# Patient Record
Sex: Female | Born: 1960 | Race: Black or African American | Hispanic: No | Smoking: Never smoker
Health system: Southern US, Community
[De-identification: ages and names within clinical notes are randomized; demographics above are authoritative.]

## PROBLEM LIST (undated history)

## (undated) DIAGNOSIS — R112 Nausea with vomiting, unspecified: Secondary | ICD-10-CM

## (undated) DIAGNOSIS — Z992 Dependence on renal dialysis: Secondary | ICD-10-CM

## (undated) DIAGNOSIS — N186 End stage renal disease: Secondary | ICD-10-CM

## (undated) DIAGNOSIS — H5461 Unqualified visual loss, right eye, normal vision left eye: Secondary | ICD-10-CM

## (undated) DIAGNOSIS — Z973 Presence of spectacles and contact lenses: Secondary | ICD-10-CM

## (undated) DIAGNOSIS — M109 Gout, unspecified: Secondary | ICD-10-CM

## (undated) DIAGNOSIS — I10 Essential (primary) hypertension: Secondary | ICD-10-CM

## (undated) DIAGNOSIS — Z9889 Other specified postprocedural states: Secondary | ICD-10-CM

## (undated) HISTORY — PX: CHOLECYSTECTOMY: SHX55

## (undated) HISTORY — PX: BREAST SURGERY: SHX581

## (undated) HISTORY — PX: ABDOMINAL HYSTERECTOMY: SHX81

## (undated) HISTORY — PX: COLONOSCOPY: SHX174

---

## 2000-12-21 ENCOUNTER — Other Ambulatory Visit: Admission: RE | Admit: 2000-12-21 | Discharge: 2000-12-21 | Payer: Self-pay | Admitting: *Deleted

## 2002-04-03 ENCOUNTER — Other Ambulatory Visit: Admission: RE | Admit: 2002-04-03 | Discharge: 2002-04-03 | Payer: Self-pay | Admitting: Obstetrics and Gynecology

## 2003-04-19 ENCOUNTER — Other Ambulatory Visit: Admission: RE | Admit: 2003-04-19 | Discharge: 2003-04-19 | Payer: Self-pay | Admitting: Obstetrics and Gynecology

## 2003-12-13 ENCOUNTER — Ambulatory Visit (HOSPITAL_COMMUNITY): Admission: RE | Admit: 2003-12-13 | Discharge: 2003-12-13 | Payer: Self-pay | Admitting: Orthopedic Surgery

## 2004-04-29 ENCOUNTER — Other Ambulatory Visit: Admission: RE | Admit: 2004-04-29 | Discharge: 2004-04-29 | Payer: Self-pay | Admitting: Obstetrics and Gynecology

## 2004-07-05 ENCOUNTER — Emergency Department (HOSPITAL_COMMUNITY): Admission: EM | Admit: 2004-07-05 | Discharge: 2004-07-06 | Payer: Self-pay | Admitting: Emergency Medicine

## 2004-11-16 ENCOUNTER — Emergency Department (HOSPITAL_COMMUNITY): Admission: EM | Admit: 2004-11-16 | Discharge: 2004-11-16 | Payer: Self-pay | Admitting: Emergency Medicine

## 2007-01-05 ENCOUNTER — Ambulatory Visit (HOSPITAL_COMMUNITY): Admission: RE | Admit: 2007-01-05 | Discharge: 2007-01-05 | Payer: Self-pay | Admitting: Family Medicine

## 2009-08-27 ENCOUNTER — Encounter: Admission: RE | Admit: 2009-08-27 | Discharge: 2009-08-27 | Payer: Self-pay | Admitting: Otolaryngology

## 2010-08-10 ENCOUNTER — Encounter: Payer: Self-pay | Admitting: Orthopedic Surgery

## 2011-07-28 ENCOUNTER — Emergency Department (HOSPITAL_COMMUNITY)
Admission: EM | Admit: 2011-07-28 | Discharge: 2011-07-28 | Disposition: A | Discharge status: Home / Self Care | Payer: Managed Care, Other (non HMO) | Source: Home / Self Care

## 2011-07-28 DIAGNOSIS — J019 Acute sinusitis, unspecified: Secondary | ICD-10-CM

## 2011-07-28 HISTORY — DX: Essential (primary) hypertension: I10

## 2011-07-28 MED ORDER — BENZONATATE 100 MG PO CAPS: ORAL_CAPSULE | ORAL | Status: AC

## 2011-07-28 MED ORDER — AMOXICILLIN-POT CLAVULANATE 875-125 MG PO TABS: 1.0000 | ORAL_TABLET | Freq: Two times a day (BID) | ORAL | Status: AC

## 2011-07-28 NOTE — ED Provider Notes (Signed)
Medical screening examination/treatment/procedure(s) were performed by non-physician practitioner and as supervising physician I was immediately available for consultation/collaboration.  LANEY,RONNIE   Ronnie Laney, MD 07/28/11 2212 

## 2011-07-28 NOTE — ED Provider Notes (Signed)
History     CSN: [id number]  Arrival date & time 07/28/11  1356   None     Chief Complaint  Patient presents with  . URI    (Consider location/radiation/quality/duration/timing/severity/associated sxs/prior treatment) HPI Comments: Pt c/o nasal congestion, HA and cough x 7 days. She states her nasal congestion is "green and lumpy." Her cough worsens when she lies down. She has been taking otc cold medications such as Dayquil without relief. She has a hx of HTN and is not currently on BP medication.   Past Medical History  Diagnosis Date  . Hypertension     Past Surgical History  Procedure Date  . Abdominal hysterectomy     History reviewed. No pertinent family history.  History  Substance Use Topics  . Smoking status: Never Smoker   . Smokeless tobacco: Not on file  . Alcohol Use: No    OB History    Grav Para Term Preterm Abortions TAB SAB Ect Mult Living                  Review of Systems  Constitutional: Negative for fever, chills and fatigue.  HENT: Positive for congestion, rhinorrhea, postnasal drip and sinus pressure. Negative for ear pain, sore throat and sneezing.   Respiratory: Positive for cough. Negative for shortness of breath and wheezing.   Cardiovascular: Negative for chest pain and palpitations.    Allergies  Review of patient's allergies indicates no known allergies.  Home Medications   Current Outpatient Rx  Name Route Sig Dispense Refill  . AMOXICILLIN-POT CLAVULANATE 875-125 MG PO TABS Oral Take 1 tablet by mouth 2 (two) times daily with a meal. 20 tablet 0  . BENZONATATE 100 MG PO CAPS  1-2 caps every 8 hrs prn cough 30 capsule 0    BP 166/93  Pulse 67  Temp(Src) 98.3 F (36.8 C) (Oral)  Resp 20  SpO2 100%  Physical Exam  Nursing note and vitals reviewed. Constitutional: She appears well-developed and well-nourished. No distress.  HENT:  Head: Normocephalic and atraumatic.  Right Ear: Tympanic membrane, external ear and ear  canal normal.  Left Ear: Tympanic membrane, external ear and ear canal normal.  Nose: Nose normal.  Mouth/Throat: Uvula is midline, oropharynx is clear and moist and mucous membranes are normal. No oropharyngeal exudate, posterior oropharyngeal edema or posterior oropharyngeal erythema.  Neck: Neck supple.  Cardiovascular: Normal rate, regular rhythm and normal heart sounds.   Pulmonary/Chest: Effort normal and breath sounds normal. No respiratory distress.  Lymphadenopathy:    She has no cervical adenopathy.  Neurological: She is alert.  Skin: Skin is warm and dry.  Psychiatric: She has a normal mood and affect.    ED Course  Procedures (including critical care time)  Labs Reviewed - No data to display No results found.   1. Acute sinusitis       MDM   Discussed with pt that otc decongestants can elevate BP. To have BP rechecked in 2 weeks when feeling better.       Carmel [place], [place] 07/28/11 1553

## 2011-07-28 NOTE — ED Notes (Signed)
C/o productive cough of green sputum, congestion, runny nose, chills for 7 days.

## 2011-10-12 ENCOUNTER — Telehealth: Payer: Self-pay

## 2011-10-20 ENCOUNTER — Other Ambulatory Visit: Payer: Self-pay

## 2011-10-20 ENCOUNTER — Telehealth: Payer: Self-pay

## 2011-10-20 DIAGNOSIS — Z139 Encounter for screening, unspecified: Secondary | ICD-10-CM

## 2011-10-20 NOTE — Telephone Encounter (Signed)
LMOM to call. ( pt was triaged on 10/10/2011 at colon cancer screening) need to schedule appt with Dr. Gala Romney.

## 2011-10-20 NOTE — Telephone Encounter (Signed)
Pt called to schedule her colonoscopy with Dr. Gala Romney. ( She saw him many years ago for her stomach) She needed a Fri. Next available with Dr. Gala Romney is on 12/11/2011. I will need to update triage prior to that0 She was traiged by Ginger on 10/10/2011 at the colon cancer screening.   Gastroenterology Pre-Procedure Form  Request Date: 10/10/2011     Requesting Physician: Nena Polio     PATIENT INFORMATION:  Angela Kerr is a 51 y.o., female (DOB=1960-10-03).  PROCEDURE: Procedure(s) requested: colonoscopy Procedure Reason: screening for colon cancer  PATIENT REVIEW QUESTIONS: The patient reports the following:   1. Diabetes Melitis: no 2. Joint replacements in the past 12 months: no 3. Major health problems in the past 3 months: no 4. Has an artificial valve or MVP:no 5. Has been advised in past to take antibiotics in advance of a procedure like teeth cleaning: no}    MEDICATIONS & ALLERGIES:    Patient reports the following regarding taking any blood thinners:   Plavix? no Aspirin?no Coumadin?  no  Patient confirms/reports the following medications:  Current Outpatient Prescriptions  Medication Sig Dispense Refill  . furosemide (LASIX) 20 MG tablet Take 20 mg by mouth daily.      Marland Kitchen lisinopril (PRINIVIL,ZESTRIL) 20 MG tablet Take 20 mg by mouth daily.        Patient confirms/reports the following allergies:  Allergies  Allergen Reactions  . Codeine Nausea And Vomiting    Patient is appropriate to schedule for requested procedure(s): yes  AUTHORIZATION INFORMATION Primary Insurance:   ID #:   Group #:  Pre-Cert / Auth required: Pre-Cert / Auth #:   Secondary Insurance:  ID #:   Group #:  Pre-Cert / Auth required: Pre-Cert / Auth #:   No orders of the defined types were placed in this encounter.    SCHEDULE INFORMATION: Procedure has been scheduled as follows:  Date: 12/11/2011    Time: 7:30 AM  Location: [facility] Short Stay  This  Gastroenterology Pre-Precedure Form is being routed to the following provider(s) for review: R. Garfield Cornea, MD

## 2011-10-20 NOTE — Telephone Encounter (Signed)
LMOM to call.

## 2011-10-21 NOTE — Telephone Encounter (Signed)
Per Ginger who triaged at the colon cancer screening, pt is AA.

## 2011-10-21 NOTE — Telephone Encounter (Signed)
Rx and instructions mailed.

## 2011-10-21 NOTE — Telephone Encounter (Signed)
Is she Caucasian or AA? If not AA, her insurance company may not pay for TCS until she turns 52. Please check on this.

## 2011-11-23 ENCOUNTER — Telehealth: Payer: Self-pay

## 2011-11-23 NOTE — Telephone Encounter (Signed)
Per Danny Lawless, email from Blair Hailey, stating that [employer] will not cover a screening colonoscopy. Michela Pitcher it would pay if she had a problem or a family hx of colon cancer. I tried to call pt and no answer. Mailed letter for her to call. Cancelled the appt for 12/11/2011 with Dr. Gala Romney. Informed Audelia Acton in Endo.

## 2011-12-11 ENCOUNTER — Encounter (HOSPITAL_COMMUNITY): Admission: RE | Payer: Self-pay | Source: Ambulatory Visit

## 2011-12-11 ENCOUNTER — Ambulatory Visit (HOSPITAL_COMMUNITY)
Admission: RE | Admit: 2011-12-11 | Payer: Managed Care, Other (non HMO) | Source: Ambulatory Visit | Admitting: Internal Medicine

## 2011-12-11 SURGERY — COLONOSCOPY
Anesthesia: Moderate Sedation

## 2012-03-01 ENCOUNTER — Emergency Department (HOSPITAL_COMMUNITY): Payer: Managed Care, Other (non HMO)

## 2012-03-01 ENCOUNTER — Emergency Department (HOSPITAL_COMMUNITY)
Admission: EM | Admit: 2012-03-01 | Discharge: 2012-03-01 | Disposition: A | Discharge status: Home / Self Care | Payer: Managed Care, Other (non HMO) | Attending: Emergency Medicine | Admitting: Emergency Medicine

## 2012-03-01 ENCOUNTER — Encounter (HOSPITAL_COMMUNITY): Payer: Self-pay | Admitting: *Deleted

## 2012-03-01 DIAGNOSIS — I10 Essential (primary) hypertension: Secondary | ICD-10-CM | POA: Insufficient documentation

## 2012-03-01 DIAGNOSIS — M659 Unspecified synovitis and tenosynovitis, unspecified site: Secondary | ICD-10-CM

## 2012-03-01 DIAGNOSIS — R229 Localized swelling, mass and lump, unspecified: Secondary | ICD-10-CM | POA: Insufficient documentation

## 2012-03-01 LAB — CBC WITH DIFFERENTIAL/PLATELET
Basophils Absolute: 0 10*3/uL (ref 0.0–0.1)
Basophils Relative: 0 % (ref 0–1)
Eosinophils Absolute: 0.1 10*3/uL (ref 0.0–0.7)
Eosinophils Relative: 2 % (ref 0–5)
HCT: 39.7 % (ref 36.0–46.0)
Hemoglobin: 13.1 g/dL (ref 12.0–15.0)
Lymphocytes Relative: 30 % (ref 12–46)
Lymphs Abs: 1.7 10*3/uL (ref 0.7–4.0)
MCH: 29.4 pg (ref 26.0–34.0)
MCHC: 33 g/dL (ref 30.0–36.0)
MCV: 89 fL (ref 78.0–100.0)
Monocytes Absolute: 0.3 10*3/uL (ref 0.1–1.0)
Monocytes Relative: 6 % (ref 3–12)
Neutro Abs: 3.5 10*3/uL (ref 1.7–7.7)
Neutrophils Relative %: 61 % (ref 43–77)
Platelets: 383 10*3/uL (ref 150–400)
RBC: 4.46 MIL/uL (ref 3.87–5.11)
RDW: 12.8 % (ref 11.5–15.5)
WBC: 5.8 10*3/uL (ref 4.0–10.5)

## 2012-03-01 MED ORDER — CLINDAMYCIN HCL 150 MG PO CAPS
300.0000 mg | ORAL_CAPSULE | Freq: Once | ORAL | Status: AC
  Administered 2012-03-01: 300 mg via ORAL
  Filled 2012-03-01: qty 2

## 2012-03-01 MED ORDER — HYDROCODONE-ACETAMINOPHEN 5-325 MG PO TABS: 1.0000 | ORAL_TABLET | Freq: Four times a day (QID) | ORAL | Status: AC | PRN

## 2012-03-01 MED ORDER — BUPIVACAINE HCL 0.5 % IJ SOLN
15.0000 mL | Freq: Once | INTRAMUSCULAR | Status: DC
  Filled 2012-03-01: qty 15

## 2012-03-01 MED ORDER — INDOMETHACIN ER 75 MG PO CPCR: 75.0000 mg | ORAL_CAPSULE | Freq: Two times a day (BID) | ORAL | Status: AC

## 2012-03-01 MED ORDER — CEPHALEXIN 500 MG PO CAPS: 500.0000 mg | ORAL_CAPSULE | Freq: Four times a day (QID) | ORAL | Status: AC

## 2012-03-01 MED ORDER — BUPIVACAINE HCL 0.25 % IJ SOLN
20.0000 mL | Freq: Once | INTRAMUSCULAR | Status: DC
  Filled 2012-03-01: qty 20

## 2012-03-01 MED ORDER — BUPIVACAINE-EPINEPHRINE (PF) 0.5% -1:200000 IJ SOLN
INTRAMUSCULAR | Status: AC
  Filled 2012-03-01: qty 1.8

## 2012-03-01 MED ORDER — CLINDAMYCIN PHOSPHATE 600 MG/50ML IV SOLN
600.0000 mg | Freq: Once | INTRAVENOUS | Status: AC
  Administered 2012-03-01: 600 mg via INTRAVENOUS
  Filled 2012-03-01: qty 50

## 2012-03-01 NOTE — Consult Note (Signed)
  See full consult note # [id number] Roseanne Kaufman MD

## 2012-03-01 NOTE — Discharge Summary (Signed)
  Dx synovitis left thumb See full consult note Roseanne Kaufman MD

## 2012-03-01 NOTE — ED Notes (Signed)
Pt is here with left thumb swelling

## 2012-03-01 NOTE — ED Notes (Signed)
Pt waiting for Dr.Gamig to see pt.  Pt updated on plan of care.

## 2012-03-01 NOTE — ED Provider Notes (Signed)
History    This chart was scribed for Angela Johns, MD, MD by Angela Kerr. The patient was seen in room [place] and the patient's care was started at 3:33PM.   CSN: [id number]  Arrival date & time 03/01/12  1328   First MD Initiated Contact with Patient 03/01/12 1532      Chief Complaint  Patient presents with  . left thumb swollen     (Consider location/radiation/quality/duration/timing/severity/associated sxs/prior treatment) The history is provided by the patient.   Angela Kerr is a 51 y.o. female who presents to the Emergency Department complaining of constant, moderate left thumb pain onset 3 days ago. Pt reports that she has been moving recently. She reports that she thought she had splinter in arm. Denies radiation. Denies any other pain.   Past Medical History  Diagnosis Date  . Hypertension     Past Surgical History  Procedure Date  . Abdominal hysterectomy     No family history on file.  History  Substance Use Topics  . Smoking status: Never Smoker   . Smokeless tobacco: Not on file  . Alcohol Use: No    OB History    Grav Para Term Preterm Abortions TAB SAB Ect Mult Living                  Review of Systems  Constitutional: Negative for fever and chills.  Gastrointestinal: Negative for nausea and vomiting.  Musculoskeletal: Positive for joint swelling.  Neurological: Positive for headaches. Negative for syncope and weakness.    Allergies  Codeine  Home Medications   Current Outpatient Rx  Name Route Sig Dispense Refill  . FUROSEMIDE 20 MG PO TABS Oral Take 20 mg by mouth daily.    Marland Kitchen LISINOPRIL 20 MG PO TABS Oral Take 20 mg by mouth daily.    . CEPHALEXIN 500 MG PO CAPS Oral Take 1 capsule (500 mg total) by mouth 4 (four) times daily. 52 capsule 0  . HYDROCODONE-ACETAMINOPHEN 5-325 MG PO TABS Oral Take 1 tablet by mouth every 6 (six) hours as needed for pain. 30 tablet 0  . INDOMETHACIN ER 75 MG PO CPCR Oral Take 1 capsule (75 mg total)  by mouth 2 (two) times daily with a meal. 48 capsule 1    BP 159/92  Pulse 65  Temp 98.2 F (36.8 C) (Oral)  Resp 16  SpO2 96%  Physical Exam  Nursing note and vitals reviewed. Constitutional: She is oriented to person, place, and time. She appears well-developed and well-nourished. No distress.  HENT:  Head: Normocephalic and atraumatic.  Neck: Normal range of motion. Neck supple.  Pulmonary/Chest: Effort normal. No respiratory distress.  Musculoskeletal:       Moderate tenderness at IP joint of left thumb Erythema and warmth of left thumb No extension to nail fold No swelling of hand Pain with ROM at IP joint of left thumb Mild fluctuance at IP joint   Neurological: She is alert and oriented to person, place, and time.    ED Course  Procedures (including critical care time) DIAGNOSTIC STUDIES: Oxygen Saturation is 96% on room air, normal by my interpretation.    COORDINATION OF CARE: 3:38PM EDP discusses pt ED treatment with pt  3:38PM EDP ordered medication:  Scheduled Meds:    Continuous Infusions:   PRN Meds:.     Labs Reviewed  CBC WITH DIFFERENTIAL  LAB REPORT - SCANNED   Dg Finger Thumb Left  03/01/2012  *RADIOLOGY REPORT*  Clinical Data: Soft  tissue swelling.  History of trauma.  LEFT THUMB 2+V  Comparison: None.  Findings: Imaged bones, joints and soft tissues appear normal.  IMPRESSION: Negative study.  Original Report Authenticated By: Angela Kerr. Angela Kerr, M.D.     1. Synovitis       MDM  Pt with swelling to IP joint, DDx includes localized abscess vs inflammatory joint such arthritis/gout vs septic arthritis.  Using sterile technique, I did a digital block, then made a small incision to the overlying skin with an 11 blade, but there was no purulent drainage.  I then placed an 18g needle into the joint space attempt aspiration, but no fluid return.  Pt given abx, I consulted Dr. Amedeo Kerr with hand to see pt.      I personally performed the  services described in this documentation, which was scribed in my presence.  The recorded information has been reviewed and considered.    Angela Johns, MD 03/03/12 [phone]

## 2012-03-01 NOTE — ED Notes (Signed)
Hand MD at bedside

## 2012-03-02 NOTE — Consult Note (Signed)
NAMECAROLYNE, Angela Kerr            ACCOUNT NO.:  [id number]  MEDICAL RECORD NO.:  [id number]  LOCATION:  CD03C                        FACILITY:  [facility]  PHYSICIAN:  Satira Anis. Sahas Sluka, M.D.DATE OF BIRTH:  [birth year]  DATE OF CONSULTATION: DATE OF DISCHARGE:  03/01/2012                                CONSULTATION   HISTORY OF PRESENT ILLNESS:  I had the pleasure to see Angela Kerr for consultation.  She was seen in the emergency room and subsequently referred.  I have reviewed her ER notes.  She saw Dr. [name] and Dr. Tamera Kerr relates to me that given her 3-day history of thumb pain she felt that perhaps there is foreign body or other abnormality present. Dr. Tamera Kerr did a block on her and subsequently aspirated multiple times of the joint and ultimately stuck a knife blade into the dorsal aspect of her thumb.  The patient states that she has had significant pain and problems prior to today's treatment by Dr. Tamera Kerr.  Dr. Tamera Kerr is now gone for the evening.  She placed a piece of gauze over the thumb and asked me to see her.  I was in the operating room, ultimately was able to see Angela Kerr and we discussed all issues.  It appears she was moving Saturday and began having some problems.  She noted swelling on Monday and worsening swelling and pain today that prompted an ER visit.  She is 51 years of age.  She is a black female. 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  She is very pleasant.  PAST MEDICAL HISTORY:  Hypertension.  ALLERGIES:  Codeine.  CURRENT MEDICATIONS:  Hypertension meds only.  SOCIAL HISTORY:  She does not smoke or drink.  She has grown children.  PAST SURGICAL HISTORY:  Hysterectomy.  PHYSICAL EXAMINATION:  Left thumb has a less than 1 cm incision dorsally where Dr. Tamera Kerr performed attempted I and D.  She has some swelling but no erythema, excessive heat or warmth about the thumb.  There is no evidence of Kanavel signs.  There is no  evidence of vascular compromise. She can flex and extend her finger.  She is not overly painful with palpation at present time, and I queried whether her block is still.  Her opposite extremity is neurovascularly intact.  Certainly left thumb has some swelling, but there is no advancing cellulitis.  She did not give a history of pseudogout or inflammatory arthritis.  IMAGING DATA:  Her x-rays are negative for fracture or dislocation or space-occupying lesion.  IMPRESSION:  Left thumb swelling intermittently for 72 hours without gross purulence noted.  PLAN:  I am going to treat her for inflammation and for posteriorly cellulitic reaction.  At this point in time, I would recommend Keflex 500 mg 1 p.o. q.i.d., and Indocin 75 mg slow release.  I am also going to give her Nucynta for pain.  I am going to see her back in the office in 24-48 hours.  I have dressed the wounds/thumb and discussed with our plans.  If she has any worsening problems, questions, or concerns she will notify me.  Otherwise, we will see her in 24-48 hours.  I do not see  any enthusiastic reason to perform an aggressive I and D at this juncture, and she and I both concur with this decision.  Dressing changes were discussed, and performed today at bedside.  She tolerated this well.  There were no complications.  We will look forward to seeing her back in 24-48 hours given the inflammation is present.     Satira Anis. Angela Kerr, M.D.     [facility]  D:  03/01/2012  T:  03/02/2012  Job:  [id number]

## 2012-08-02 ENCOUNTER — Encounter (HOSPITAL_COMMUNITY): Payer: Self-pay | Admitting: *Deleted

## 2012-08-02 ENCOUNTER — Emergency Department (INDEPENDENT_AMBULATORY_CARE_PROVIDER_SITE_OTHER): Payer: Managed Care, Other (non HMO)

## 2012-08-02 ENCOUNTER — Emergency Department (HOSPITAL_COMMUNITY)
Admission: EM | Admit: 2012-08-02 | Discharge: 2012-08-02 | Disposition: A | Discharge status: Home / Self Care | Payer: Managed Care, Other (non HMO) | Source: Home / Self Care | Attending: Emergency Medicine | Admitting: Emergency Medicine

## 2012-08-02 DIAGNOSIS — M109 Gout, unspecified: Secondary | ICD-10-CM

## 2012-08-02 LAB — URIC ACID: Uric Acid, Serum: 9.3 mg/dL — ABNORMAL HIGH (ref 2.4–7.0)

## 2012-08-02 MED ORDER — PREDNISONE 20 MG PO TABS: 20.0000 mg | ORAL_TABLET | Freq: Two times a day (BID) | ORAL | Status: DC

## 2012-08-02 MED ORDER — COLCHICINE 0.6 MG PO TABS: ORAL_TABLET | ORAL | Status: DC

## 2012-08-02 NOTE — ED Notes (Signed)
Pt reports right ankle pain with no known injury  - pain for the last two days.

## 2012-08-02 NOTE — ED Provider Notes (Signed)
Chief Complaint  Patient presents with  . Ankle Pain    History of Present Illness:   The patient is a 52 year old female who has had a two-day history of right ankle pain, swelling, and heat. She denies any injury. The pain does seem to start on its own. It's very tender to touch and painful to walk. She's never had any pain like this before in the ankle, but does recall an episode a few years ago of pain in her left thumb. She had x-rays done which were negative. She even had in size but there was no sign of infection. And it was felt that she might have gout and she was treated with physical therapy. She denies any fever or chills. There is a family history of gout. There's been no change in her medication or diet recently.  Review of Systems:  Other than noted above, the patient denies any of the following symptoms: Systemic:  No fevers, chills, sweats, or aches.  No fatigue or tiredness. Musculoskeletal:  No joint pain, arthritis, bursitis, swelling, back pain, or neck pain. Neurological:  No muscular weakness, paresthesias, headache, or trouble with speech or coordination.  No dizziness.  Lima:  Past medical history, family history, social history, meds, and allergies were reviewed.  Physical Exam:   Vital signs:  BP 165/94  Pulse 66  Temp 98.9 F (37.2 C) (Oral)  Resp 16  SpO2 100% Gen:  Alert and oriented times 3.  In no distress. Musculoskeletal: The right ankle it is swollen and tender to palpation both anteriorly, medially, and laterally. There is some heat but no erythema. It hurts with slight movement. Otherwise, all joints had a full a ROM with no swelling, bruising or deformity.  No edema, pulses full. Extremities were warm and pink.  Capillary refill was brisk.  Skin:  Clear, warm and dry.  No rash. Neuro:  Alert and oriented times 3.  Muscle strength was normal.  Sensation was intact to light touch.   Radiology:  Dg Ankle Complete Right  08/02/2012  *RADIOLOGY REPORT*   Clinical Data: Right ankle pain  RIGHT ANKLE - COMPLETE 3+ VIEW  Comparison: None.  Findings: No fracture or dislocation is seen.  The ankle mortise is intact.  The base of the fifth metatarsal is unremarkable.  Mild to moderate soft tissue swelling, more prominent laterally.  IMPRESSION: No fracture or dislocation is seen.  Soft tissue swelling.   Original Report Authenticated By: Julian Hy, M.D.    I reviewed the images independently and personally and concur with the radiologist's findings.  Results for orders placed during the hospital encounter of 08/02/12  URIC ACID      Component Value Range   Uric Acid, Serum 9.3 (*) 2.4 - 7.0 mg/dL   Assessment:  The encounter diagnosis was Gout attack.  Plan:   1.  The following meds were prescribed:   New Prescriptions   COLCHICINE 0.6 MG TABLET    Take 2 now and 1 in 1 hour.  May repeat dose once daily.  For gout attack.   PREDNISONE (DELTASONE) 20 MG TABLET    Take 1 tablet (20 mg total) by mouth 2 (two) times daily.   2.  The patient was instructed in symptomatic care, including rest and activity, elevation, application of ice and compression.  Appropriate handouts were given. 3.  The patient was told to return if becoming worse in any way, if no better in 3 or 4 days, and given some red  flag symptoms that would indicate earlier return.   4.  The patient was told to follow up with her primary care physician with regard to the elevated uric acid level.    Harden Mo, MD 08/02/12 [phone]

## 2012-08-18 ENCOUNTER — Telehealth (HOSPITAL_COMMUNITY): Payer: Self-pay | Admitting: *Deleted

## 2012-11-09 ENCOUNTER — Encounter (HOSPITAL_COMMUNITY): Payer: Self-pay | Admitting: *Deleted

## 2012-11-09 ENCOUNTER — Emergency Department (HOSPITAL_COMMUNITY)
Admission: EM | Admit: 2012-11-09 | Discharge: 2012-11-09 | Disposition: A | Discharge status: Home / Self Care | Payer: Managed Care, Other (non HMO) | Source: Home / Self Care | Attending: Family Medicine | Admitting: Family Medicine

## 2012-11-09 DIAGNOSIS — J329 Chronic sinusitis, unspecified: Secondary | ICD-10-CM

## 2012-11-09 DIAGNOSIS — I10 Essential (primary) hypertension: Secondary | ICD-10-CM

## 2012-11-09 DIAGNOSIS — J40 Bronchitis, not specified as acute or chronic: Secondary | ICD-10-CM

## 2012-11-09 DIAGNOSIS — M7700 Medial epicondylitis, unspecified elbow: Secondary | ICD-10-CM

## 2012-11-09 DIAGNOSIS — M7702 Medial epicondylitis, left elbow: Secondary | ICD-10-CM

## 2012-11-09 MED ORDER — AMOXICILLIN 500 MG PO CAPS: 500.0000 mg | ORAL_CAPSULE | Freq: Three times a day (TID) | ORAL | Status: DC

## 2012-11-09 MED ORDER — CETIRIZINE HCL 10 MG PO TABS: 10.0000 mg | ORAL_TABLET | Freq: Every day | ORAL | Status: DC

## 2012-11-09 MED ORDER — ALBUTEROL SULFATE HFA 108 (90 BASE) MCG/ACT IN AERS: 1.0000 | INHALATION_SPRAY | Freq: Four times a day (QID) | RESPIRATORY_TRACT | Status: DC | PRN

## 2012-11-09 MED ORDER — LISINOPRIL 20 MG PO TABS: 20.0000 mg | ORAL_TABLET | Freq: Every day | ORAL | Status: DC

## 2012-11-09 MED ORDER — BENZONATATE 100 MG PO CAPS: 100.0000 mg | ORAL_CAPSULE | Freq: Three times a day (TID) | ORAL | Status: DC

## 2012-11-09 MED ORDER — PREDNISONE 20 MG PO TABS: ORAL_TABLET | ORAL | Status: DC

## 2012-11-09 MED ORDER — TRAMADOL HCL 50 MG PO TABS: 50.0000 mg | ORAL_TABLET | Freq: Three times a day (TID) | ORAL | Status: DC | PRN

## 2012-11-09 NOTE — ED Notes (Signed)
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

## 2012-11-09 NOTE — ED Provider Notes (Signed)
History     CSN: [id number]  Arrival date & time 11/09/12  1219   First MD Initiated Contact with Patient 11/09/12 1238      Chief Complaint  Patient presents with  . Cough    (Consider location/radiation/quality/duration/timing/severity/associated sxs/prior treatment) HPI Comments: 52 year old female with history of hypertension. Here complaining of nasal congestion, rhinorrhea and sinus pressure and frontal headache for over a week. Symptoms also associated with coughing episodes with yellow sputum and wheezing. 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 She denies fever or chills although reports she has general malaise and decreased appetite. Denies abdominal pain, vomiting or diarrhea. Has taken over-the-counter decongestant with some relief. Patient also reports left elbow pain. States that she needs to get her arms up and down and flex her elbows repeatedly through the day while making cigarettes. She has had similar episodes of pain in the past. Has taken over-the-counter or Advil with no significant improvement.   Past Medical History  Diagnosis Date  . Hypertension     Past Surgical History  Procedure Laterality Date  . Abdominal hysterectomy      No family history on file.  History  Substance Use Topics  . Smoking status: Never Smoker   . Smokeless tobacco: Not on file  . Alcohol Use: No    OB History   Grav Para Term Preterm Abortions TAB SAB Ect Mult Living                  Review of Systems  Constitutional: Negative for fever, chills, appetite change and fatigue.  HENT: Positive for congestion, rhinorrhea, postnasal drip and sinus pressure.   Eyes: Negative for pain, discharge and visual disturbance.  Respiratory: Positive for cough and wheezing. Negative for shortness of breath.   Cardiovascular: Negative for chest pain.  Gastrointestinal: Negative for nausea, vomiting,  abdominal pain and constipation.  Musculoskeletal: Positive for arthralgias. Negative for joint swelling.  Skin: Negative for rash.  Neurological: Positive for headaches. Negative for dizziness, tremors, syncope, weakness, light-headedness and numbness.  All other systems reviewed and are negative.    Allergies  Codeine  Home Medications   Current Outpatient Rx  Name  Route  Sig  Dispense  Refill  . albuterol (PROVENTIL HFA;VENTOLIN HFA) 108 (90 BASE) MCG/ACT inhaler   Inhalation   Inhale 1-2 puffs into the lungs every 6 (six) hours as needed for wheezing.   1 Inhaler   0   . amoxicillin (AMOXIL) 500 MG capsule   Oral   Take 1 capsule (500 mg total) by mouth 3 (three) times daily.   21 capsule   0   . cetirizine (ZYRTEC) 10 MG tablet   Oral   Take 1 tablet (10 mg total) by mouth daily.   30 tablet   0   . colchicine 0.6 MG tablet      Take 2 now and 1 in 1 hour.  May repeat dose once daily.  For gout attack.   12 tablet   0   . furosemide (LASIX) 20 MG tablet   Oral   Take 20 mg by mouth daily.         Marland Kitchen lisinopril (PRINIVIL,ZESTRIL) 20 MG tablet   Oral   Take 1 tablet (20 mg total) by mouth daily.   30 tablet   0   . predniSONE (DELTASONE) 20 MG tablet      2 tabs po daily  for 5 days   10 tablet   0   . traMADol (ULTRAM) 50 MG tablet   Oral   Take 1 tablet (50 mg total) by mouth every 8 (eight) hours as needed for pain.   20 tablet   0     BP 183/103  Pulse 82  Temp(Src) 98.1 F (36.7 C) (Oral)  Resp 16  SpO2 100%  Physical Exam  Nursing note and vitals reviewed. Constitutional: She is oriented to person, place, and time. She appears well-developed and well-nourished. No distress.  HENT:  Head: Normocephalic and atraumatic.  Nasal Congestion with erythema and swelling of nasal turbinates, yellow rhinorrhea post nasal drip. Significant pharyngeal erythema no exudates. No uvula deviation. No trismus. TM's normal.  Eyes: Conjunctivae and  EOM are normal. Pupils are equal, round, and reactive to light. Right eye exhibits no discharge. Left eye exhibits no discharge.  Neck: Neck supple. No JVD present. No thyromegaly present.  Cardiovascular: Normal rate, regular rhythm and normal heart sounds.  Exam reveals no gallop and no friction rub.   No murmur heard. Pulmonary/Chest: Effort normal and breath sounds normal. She has no rales.  bonchitic cough expiratory rhonchi sporadically and bilateral. Respiratory distress. No active wheezing, no orthopnea or tachypnea.   Musculoskeletal:  Left elbow: No deformity. No erythema or swelling. Fair range of motion with full section and extension despite reported pain. No clicks. Tenderness to palpation over and around medial epicondyle. Pain worse with forearm external and internal rotation against resistance. Forearm and wrist exam normal. Entire left upper extremity appears neurovascularly intact.  Lymphadenopathy:    She has no cervical adenopathy.  Neurological: She is alert and oriented to person, place, and time.  Skin: No rash noted. She is not diaphoretic.    ED Course  Procedures (including critical care time)  Labs Reviewed - No data to display No results found.   1. Medial epicondylitis, left   2. Bronchitis   3. Sinusitis   4. Hypertension       MDM  #1 treated with elbow brace. Prescribed tramadol for pain avoided nonsteroidal anti-inflammatory medications as patient has high blood pressure. #2 prescribed albuterol. Patient also had amoxicillin and cetirizine or prescription for #3 sinusitis. #4 refilled lisinopril. Patient was asked to make an appointment with her primary care provider in one or 2 weeks to recheck her blood pressure. Supportive care and red flags that should prompt her return to medical attention discussed with patient and provided in writing.      and  Randa Spike, MD 11/11/12 1102

## 2012-11-11 NOTE — ED Notes (Signed)
FMLA papers completed by Dr Gunnar Bulla- Coll, sent by fax to HR for [employer] for patient

## 2012-11-11 NOTE — ED Notes (Signed)
Papers en route for  FMLA

## 2012-11-17 ENCOUNTER — Emergency Department (HOSPITAL_COMMUNITY)
Admission: EM | Admit: 2012-11-17 | Discharge: 2012-11-17 | Disposition: A | Discharge status: Home / Self Care | Payer: Managed Care, Other (non HMO) | Source: Home / Self Care

## 2013-07-27 ENCOUNTER — Emergency Department (INDEPENDENT_AMBULATORY_CARE_PROVIDER_SITE_OTHER): Payer: Managed Care, Other (non HMO)

## 2013-07-27 ENCOUNTER — Emergency Department (HOSPITAL_COMMUNITY)
Admission: EM | Admit: 2013-07-27 | Discharge: 2013-07-27 | Disposition: A | Discharge status: Home / Self Care | Payer: Managed Care, Other (non HMO) | Source: Home / Self Care

## 2013-07-27 ENCOUNTER — Encounter (HOSPITAL_COMMUNITY): Payer: Self-pay | Admitting: Emergency Medicine

## 2013-07-27 DIAGNOSIS — R059 Cough, unspecified: Secondary | ICD-10-CM

## 2013-07-27 DIAGNOSIS — R05 Cough: Secondary | ICD-10-CM

## 2013-07-27 DIAGNOSIS — R0789 Other chest pain: Secondary | ICD-10-CM

## 2013-07-27 DIAGNOSIS — J309 Allergic rhinitis, unspecified: Secondary | ICD-10-CM

## 2013-07-27 DIAGNOSIS — J40 Bronchitis, not specified as acute or chronic: Secondary | ICD-10-CM

## 2013-07-27 DIAGNOSIS — R071 Chest pain on breathing: Secondary | ICD-10-CM

## 2013-07-27 MED ORDER — AZITHROMYCIN 250 MG PO TABS
ORAL_TABLET | ORAL | Status: DC
Start: 2013-07-27 — End: 2016-05-25

## 2013-07-27 MED ORDER — PREDNISONE 10 MG PO TABS: ORAL_TABLET | ORAL | Status: DC

## 2013-07-27 NOTE — ED Provider Notes (Signed)
CSN: [id number]     Arrival date & time 07/27/13  1739 History   None    Chief Complaint  Patient presents with  . Flank Pain   (Consider location/radiation/quality/duration/timing/severity/associated sxs/prior Treatment) HPI Comments: 53 yo pleasant female presents with dry cough x several days. She notes mild nasal drainage and ear fullness. She notes mild tenderness with touching right lateral chest. She denies PMHX or FHX of Blood clots/ kidney stones. She has not tried any oTC. She denies any pain with deep breathing or SOB.   Past Medical History  Diagnosis Date  . Hypertension    Past Surgical History  Procedure Laterality Date  . Abdominal hysterectomy     No family history on file. History  Substance Use Topics  . Smoking status: Never Smoker   . Smokeless tobacco: Not on file  . Alcohol Use: No   OB History   Grav Para Term Preterm Abortions TAB SAB Ect Mult Living                 Review of Systems  HENT: Positive for congestion, ear pain and postnasal drip.   Respiratory: Positive for cough.   Cardiovascular: Positive for chest pain.       Chest wall right  All other systems reviewed and are negative.    Allergies  Codeine  Home Medications   Current Outpatient Rx  Name  Route  Sig  Dispense  Refill  . lisinopril (PRINIVIL,ZESTRIL) 20 MG tablet   Oral   Take 1 tablet (20 mg total) by mouth daily.   30 tablet   0   . albuterol (PROVENTIL HFA;VENTOLIN HFA) 108 (90 BASE) MCG/ACT inhaler   Inhalation   Inhale 1-2 puffs into the lungs every 6 (six) hours as needed for wheezing.   1 Inhaler   0   . amoxicillin (AMOXIL) 500 MG capsule   Oral   Take 1 capsule (500 mg total) by mouth 3 (three) times daily.   21 capsule   0   . benzonatate (TESSALON) 100 MG capsule   Oral   Take 1 capsule (100 mg total) by mouth every 8 (eight) hours.   21 capsule   0   . cetirizine (ZYRTEC) 10 MG tablet   Oral   Take 1 tablet (10 mg total) by mouth daily.  30 tablet   0   . colchicine 0.6 MG tablet      Take 2 now and 1 in 1 hour.  May repeat dose once daily.  For gout attack.   12 tablet   0   . furosemide (LASIX) 20 MG tablet   Oral   Take 20 mg by mouth daily.         . predniSONE (DELTASONE) 20 MG tablet      2 tabs po daily for 5 days   10 tablet   0   . traMADol (ULTRAM) 50 MG tablet   Oral   Take 1 tablet (50 mg total) by mouth every 8 (eight) hours as needed for pain.   20 tablet   0    BP 183/104  Pulse 82  Temp(Src) 98.5 F (36.9 C) (Oral)  Resp 20  SpO2 100% Physical Exam  Nursing note and vitals reviewed. Constitutional: She is oriented to person, place, and time. She appears well-developed and well-nourished.  HENT:  Head: Normocephalic and atraumatic.  Right Ear: External ear normal.  Left Ear: External ear normal.  Nose: Nose normal.  Mouth/Throat: Oropharynx  is clear and moist.  TMS cloudy/ mildly injected.  Eyes: Conjunctivae and EOM are normal.  Neck: Normal range of motion.  Cardiovascular: Normal rate, regular rhythm, normal heart sounds and intact distal pulses.   Pulmonary/Chest: Effort normal and breath sounds normal. She exhibits tenderness.    Abdominal: Soft. Bowel sounds are normal. She exhibits no distension and no mass. There is no tenderness.  Musculoskeletal: Normal range of motion.  Lymphadenopathy:    She has no cervical adenopathy.  Neurological: She is alert and oriented to person, place, and time.  Skin: Skin is warm and dry.  Psychiatric: She has a normal mood and affect. Judgment normal.   REcheck 165/ 88 ED Course  Procedures (including critical care time) Labs Review Labs Reviewed - No data to display Imaging Review Dg Chest 2 View  07/27/2013   CLINICAL DATA:  Flank pain.  EXAM: CHEST  2 VIEW  COMPARISON:  08/27/1998 low  FINDINGS: The heart size and mediastinal contours are within normal limits. Both lungs are clear. The visualized skeletal structures are  unremarkable. Small rectangular radiopaque density in the right lateral chest is likely external to the patient.  IMPRESSION: No active cardiopulmonary disease.   Electronically Signed   By: Markus Daft M.D.   On: 07/27/2013 20:10     MDM  1. Concern for Bronchitis vs chest wall pain/ Costochondritis/ Allergic Rhinitis- If sx increase ER.  Try Pred 10 mg DP AD. Mucinex/ Allegra/ Nasacort OTC AD. If sx increase with production of color start Zpak AD. 2. HTN- check BP if stays elevated f/u PCP ASAP, overdue for labs with PCP and needs evaluation    Ardis Hughs, PA-C 07/27/13 2158

## 2013-07-27 NOTE — ED Notes (Signed)
Pt c/o right side/rib cage pain onset 2 weeks... Pain increases when she sits down Denies: inj/trauma... States she got over a cold last week and was coughing a lot BP today is 183/104... Has a HA but denies: CP, SOB, weakness, diaphoresis, nauseas She is alert w/no signs of acute distress.

## 2013-07-27 NOTE — Discharge Instructions (Signed)
Bronchitis Bronchitis is a problem of the air tubes leading to your lungs. This problem makes it hard for air to get in and out of the lungs. You may cough a lot because your air tubes are narrow. Going without care can cause lasting (chronic) bronchitis. HOME CARE   Drink enough fluids to keep your pee (urine) clear or pale yellow.  Use a cool mist humidifier.  Quit smoking if you smoke. If you keep smoking, the bronchitis might not get better.  Only take medicine as told by your doctor. GET HELP RIGHT AWAY IF:   Coughing keeps you awake.  You start to wheeze.  You become more sick or weak.  You have a hard time breathing or get short of breath.  You cough up blood.  Coughing lasts more than 2 weeks.  You have a fever.  Your baby is older than 3 months with a rectal temperature of 102 F (38.9 C) or higher.  Your baby is 1 months old or younger with a rectal temperature of 100.4 F (38 C) or higher. MAKE SURE YOU:  Understand these instructions.  Will watch your condition.  Will get help right away if you are not doing well or get worse. Document Released: 12/23/2007 Document Revised: 09/28/2011 Document Reviewed: 02/28/2013 [facility] Patient Information 2014 [place], [place]. Allergic Rhinitis Get Allegra OTC As Directed and Mucinex as directed Allergic rhinitis is when the mucous membranes in the nose respond to allergens. Allergens are particles in the air that cause your body to have an allergic reaction. This causes you to release allergic antibodies. Through a chain of events, these eventually cause you to release histamine into the blood stream (hence the use of antihistamines). Although meant to be protective to the body, it is this release that causes your discomfort, such as frequent sneezing, congestion and an itchy runny nose.  CAUSES  The pollen allergens may come from grasses, trees, and weeds. This is seasonal allergic rhinitis, or "hay fever." Other  allergens cause year-round allergic rhinitis (perennial allergic rhinitis) such as house dust mite allergen, pet dander and mold spores.  SYMPTOMS   Nasal stuffiness (congestion).  Runny, itchy nose with sneezing and tearing of the eyes.  There is often an itching of the mouth, eyes and ears. It cannot be cured, but it can be controlled with medications. DIAGNOSIS  If you are unable to determine the offending allergen, skin or blood testing may find it. TREATMENT   Avoid the allergen.  Medications and allergy shots (immunotherapy) can help.  Hay fever may often be treated with antihistamines in pill or nasal spray forms. Antihistamines block the effects of histamine. There are over-the-counter medicines that may help with nasal congestion and swelling around the eyes. Check with your caregiver before taking or giving this medicine. If the treatment above does not work, there are many new medications your caregiver can prescribe. Stronger medications may be used if initial measures are ineffective. Desensitizing injections can be used if medications and avoidance fails. Desensitization is when a patient is given ongoing shots until the body becomes less sensitive to the allergen. Make sure you follow up with your caregiver if problems continue. SEEK MEDICAL CARE IF:   You develop fever (more than 100.5 F (38.1 C).  You develop a cough that does not stop easily (persistent).  You have shortness of breath.  You start wheezing.  Symptoms interfere with normal daily activities. Document Released: 03/31/2001 Document Revised: 09/28/2011 Document Reviewed: 10/10/2008 [employer] Patient  Information ©2014 [employer]. ° °

## 2013-07-31 NOTE — ED Provider Notes (Signed)
Medical screening examination/treatment/procedure(s) were performed by resident physician or non-physician practitioner and as supervising physician I was immediately available for consultation/collaboration.   Pauline Good MD.   Billy Fischer, MD 07/31/13 [phone]

## 2016-05-25 ENCOUNTER — Emergency Department (HOSPITAL_COMMUNITY): Payer: 59

## 2016-05-25 ENCOUNTER — Emergency Department (HOSPITAL_COMMUNITY)
Admission: EM | Admit: 2016-05-25 | Discharge: 2016-05-25 | Disposition: A | Discharge status: Home / Self Care | Payer: 59 | Attending: Emergency Medicine | Admitting: Emergency Medicine

## 2016-05-25 ENCOUNTER — Encounter (HOSPITAL_COMMUNITY): Payer: Self-pay

## 2016-05-25 DIAGNOSIS — N12 Tubulo-interstitial nephritis, not specified as acute or chronic: Secondary | ICD-10-CM | POA: Diagnosis not present

## 2016-05-25 DIAGNOSIS — N289 Disorder of kidney and ureter, unspecified: Secondary | ICD-10-CM

## 2016-05-25 DIAGNOSIS — I10 Essential (primary) hypertension: Secondary | ICD-10-CM | POA: Insufficient documentation

## 2016-05-25 DIAGNOSIS — Z79899 Other long term (current) drug therapy: Secondary | ICD-10-CM | POA: Insufficient documentation

## 2016-05-25 DIAGNOSIS — R1012 Left upper quadrant pain: Secondary | ICD-10-CM | POA: Diagnosis present

## 2016-05-25 LAB — BASIC METABOLIC PANEL
ANION GAP: 6 (ref 5–15)
BUN: 41 mg/dL — ABNORMAL HIGH (ref 6–20)
CO2: 26 mmol/L (ref 22–32)
Calcium: 9.6 mg/dL (ref 8.9–10.3)
Chloride: 109 mmol/L (ref 101–111)
Creatinine, Ser: 2.77 mg/dL — ABNORMAL HIGH (ref 0.44–1.00)
GFR calc Af Amer: 21 mL/min — ABNORMAL LOW (ref >60)
GFR, EST NON AFRICAN AMERICAN: 18 mL/min — AB (ref >60)
GLUCOSE: 98 mg/dL (ref 65–99)
POTASSIUM: 4.2 mmol/L (ref 3.5–5.1)
Sodium: 141 mmol/L (ref 135–145)

## 2016-05-25 LAB — CBC
HEMATOCRIT: 40 % (ref 36.0–46.0)
HEMOGLOBIN: 12.7 g/dL (ref 12.0–15.0)
MCH: 28.2 pg (ref 26.0–34.0)
MCHC: 31.8 g/dL (ref 30.0–36.0)
MCV: 88.7 fL (ref 78.0–100.0)
Platelets: 387 10*3/uL (ref 150–400)
RBC: 4.51 MIL/uL (ref 3.87–5.11)
RDW: 13.1 % (ref 11.5–15.5)
WBC: 4.4 10*3/uL (ref 4.0–10.5)

## 2016-05-25 LAB — URINE MICROSCOPIC-ADD ON

## 2016-05-25 LAB — LIPASE, BLOOD: Lipase: 52 U/L — ABNORMAL HIGH (ref 11–51)

## 2016-05-25 LAB — URINALYSIS, ROUTINE W REFLEX MICROSCOPIC
Bilirubin Urine: NEGATIVE
Glucose, UA: NEGATIVE mg/dL
Hgb urine dipstick: NEGATIVE
KETONES UR: NEGATIVE mg/dL
LEUKOCYTES UA: NEGATIVE
NITRITE: NEGATIVE
PH: 5.5 (ref 5.0–8.0)
Protein, ur: 100 mg/dL — AB
Specific Gravity, Urine: 1.012 (ref 1.005–1.030)

## 2016-05-25 LAB — I-STAT TROPONIN, ED: Troponin i, poc: 0 ng/mL (ref 0.00–0.08)

## 2016-05-25 MED ORDER — DEXTROSE 5 % IV SOLN
1.0000 g | Freq: Once | INTRAVENOUS | Status: AC
  Administered 2016-05-25: 1 g via INTRAVENOUS
  Filled 2016-05-25: qty 10

## 2016-05-25 MED ORDER — CEPHALEXIN 500 MG PO CAPS: 500.0000 mg | ORAL_CAPSULE | Freq: Four times a day (QID) | ORAL | 0 refills | Status: DC

## 2016-05-25 MED ORDER — OXYCODONE-ACETAMINOPHEN 5-325 MG PO TABS: 1.0000 | ORAL_TABLET | ORAL | 0 refills | Status: DC | PRN

## 2016-05-25 MED ORDER — ONDANSETRON HCL 4 MG/2ML IJ SOLN
4.0000 mg | Freq: Once | INTRAMUSCULAR | Status: AC
  Administered 2016-05-25: 4 mg via INTRAVENOUS
  Filled 2016-05-25: qty 2

## 2016-05-25 MED ORDER — ONDANSETRON HCL 4 MG PO TABS: 4.0000 mg | ORAL_TABLET | Freq: Three times a day (TID) | ORAL | 0 refills | Status: DC | PRN

## 2016-05-25 MED ORDER — MORPHINE SULFATE (PF) 4 MG/ML IV SOLN
4.0000 mg | Freq: Once | INTRAVENOUS | Status: AC
  Administered 2016-05-25: 4 mg via INTRAVENOUS
  Filled 2016-05-25: qty 1

## 2016-05-25 NOTE — ED Triage Notes (Signed)
Pt reports mid back pain radiating into her LUQ abd. Pt initially thought it was gas. Pt denies any injury. Pt reports some nausea today, denies vomiting or diarrhea.

## 2016-05-25 NOTE — ED Notes (Signed)
Called patient 3x, no response. 

## 2016-05-25 NOTE — Discharge Instructions (Signed)
Return if pain is not being adequately controlled, or if you start running a fever or start vomiting.

## 2016-05-25 NOTE — ED Provider Notes (Signed)
[facility] DEPT Provider Note   CSN: [id number] Arrival date & time: 05/25/16  1142     History   Chief Complaint Chief Complaint  Patient presents with  . Back Pain  . Abdominal Pain    HPI Angela Kerr is a 55 y.o. female.  She has a history of hypertension and renal insufficiency. For the last 5 days, she has had pain in the left flank area with some radiations into the left upper lateral abdominal area. Pain is severe and she rates at 10/10. There is no associated nausea or vomiting. She denies fever chills or sweats. She denies constipation or diarrhea. She denies urinary difficulty. Pain is not affected by movement or position or by eating. She has tried taking acetaminophen without relief. She denies any recent trauma.   The history is provided by the patient.  Back Pain   Associated symptoms include abdominal pain.  Abdominal Pain      Past Medical History:  Diagnosis Date  . Hypertension     There are no active problems to display for this patient.   Past Surgical History:  Procedure Laterality Date  . ABDOMINAL HYSTERECTOMY      OB History    No data available       Home Medications    Prior to Admission medications   Medication Sig Start Date End Date Taking? Authorizing Provider  albuterol (PROVENTIL HFA;VENTOLIN HFA) 108 (90 BASE) MCG/ACT inhaler Inhale 1-2 puffs into the lungs every 6 (six) hours as needed for wheezing. 11/09/12   Leonette Monarch Moreno-Coll, MD  azithromycin (ZITHROMAX) 250 MG tablet Take 2 tablets (500 mg) on  Day 1,  followed by 1 tablet (250 mg) once daily on Days 2 through 5. 07/27/13   Kelby Aline, PA-C  colchicine 0.6 MG tablet Take 2 now and 1 in 1 hour.  May repeat dose once daily.  For gout attack. 08/02/12   Harden Mo, MD  furosemide (LASIX) 20 MG tablet Take 20 mg by mouth daily.    Historical Provider, MD  lisinopril (PRINIVIL,ZESTRIL) 20 MG tablet Take 1 tablet (20 mg total) by mouth daily. 11/09/12   Adlih  Moreno-Coll, MD  predniSONE (DELTASONE) 10 MG tablet 1 po TID x 3 days, 1 PO BID x 3 days, 1 po QD x 5 days 07/27/13   Kelby Aline, PA-C  traMADol (ULTRAM) 50 MG tablet Take 1 tablet (50 mg total) by mouth every 8 (eight) hours as needed for pain. 11/09/12   Randa Spike, MD    Family History No family history on file.  Social History Social History  Substance Use Topics  . Smoking status: Never Smoker  . Smokeless tobacco: Never Used  . Alcohol use No     Allergies   Codeine   Review of Systems Review of Systems  Gastrointestinal: Positive for abdominal pain.  Musculoskeletal: Positive for back pain.  All other systems reviewed and are negative.    Physical Exam Updated Vital Signs BP 170/100 (BP Location: Right Arm)   Pulse 63   Temp 98.1 F (36.7 C) (Oral)   Resp 17   Ht 5\' 3"  (1.6 m)   Wt 241 lb (109.3 kg)   SpO2 97%   BMI 42.69 kg/m   Physical Exam  Nursing note and vitals reviewed.  Obese 55 year old female, resting comfortably and in no acute distress. Vital signs are Significant for hypertension. Oxygen saturation is 97%, which is normal. Head is normocephalic and atraumatic. PERRLA, EOMI.  Oropharynx is clear. Neck is nontender and supple without adenopathy or JVD. Back is nontender in the midline. There is moderate left CVA tenderness. Lungs are clear without rales, wheezes, or rhonchi. Chest is nontender. Heart has regular rate and rhythm without murmur. Abdomen is soft, flat, nontender without masses or hepatosplenomegaly and peristalsis is normoactive. Extremities have no cyanosis or edema, full range of motion is present. Skin is warm and dry without rash. Neurologic: Mental status is normal, cranial nerves are intact, there are no motor or sensory deficits.  ED Treatments / Results  Labs (all labs ordered are listed, but only abnormal results are displayed) Labs Reviewed  BASIC METABOLIC PANEL - Abnormal; Notable for the following:        Result Value   BUN 41 (*)    Creatinine, Ser 2.77 (*)    GFR calc non Af Amer 18 (*)    GFR calc Af Amer 21 (*)    All other components within normal limits  LIPASE, BLOOD - Abnormal; Notable for the following:    Lipase 52 (*)    All other components within normal limits  URINALYSIS, ROUTINE W REFLEX MICROSCOPIC (NOT AT [facility]) - Abnormal; Notable for the following:    Protein, ur 100 (*)    All other components within normal limits  URINE MICROSCOPIC-ADD ON - Abnormal; Notable for the following:    Squamous Epithelial / LPF 0-5 (*)    Bacteria, UA MANY (*)    All other components within normal limits  URINE CULTURE  CBC  I-STAT TROPOININ, ED    EKG  EKG Interpretation  Date/Time:  Monday May 25 2016 11:57:56 EST Ventricular Rate:  86 PR Interval:  176 QRS Duration: 86 QT Interval:  370 QTC Calculation: 442 R Axis:   -43 Text Interpretation:  Normal sinus rhythm Left axis deviation Minimal voltage criteria for LVH, may be normal variant Septal infarct , age undetermined Abnormal ECG No old tracing to compare Confirmed by [facility]  MD, Alonzo Owczarzak ([id number]) on 05/25/2016 2:52:12 PM       Radiology Dg Chest 2 View  Result Date: 05/25/2016 CLINICAL DATA:  Left upper quadrant pain. EXAM: CHEST  2 VIEW COMPARISON:  07/27/2013. FINDINGS: Mediastinum and hilar structures normal. Low lung volumes with mild bibasilar subsegmental atelectasis. Heart size stable. No focal bony abnormality . IMPRESSION: Low lung volumes with mild bibasilar subsegmental atelectasis. Electronically Signed   By: Marcello Moores  Register   On: 05/25/2016 12:44   Ct Renal Stone Study  Result Date: 05/25/2016 CLINICAL DATA:  Left lower quadrant pain for 2 days, history of renal stones EXAM: CT ABDOMEN AND PELVIS WITHOUT CONTRAST TECHNIQUE: Multidetector CT imaging of the abdomen and pelvis was performed following the standard protocol without IV contrast. COMPARISON:  None. FINDINGS: Lower chest: Lung bases shows mild  posterior atelectasis. Hepatobiliary: Unenhanced liver shows no biliary ductal dilatation. No calcified gallstones are noted within gallbladder. Pancreas: Unremarkable. No pancreatic ductal dilatation or surrounding inflammatory changes. Spleen: Normal in size without focal abnormality.Unenhanced Adrenals/Urinary Tract: No adrenal gland mass. There is a probable cyst in posterior mid pole of the left kidney measures 1.9 cm. No nephrolithiasis. No hydronephrosis or hydroureter. No calcified ureteral calculi. No calcified calculi are noted within urinary bladder. Stomach/Bowel: No small bowel obstruction. No pericecal inflammation. Normal appendix. No evidence of colitis or diverticulitis. The terminal ileum is unremarkable. Vascular/Lymphatic: No retroperitoneal or mesenteric adenopathy. No aortic aneurysm. Reproductive: The patient is status post hysterectomy Other: No ascites or free abdominal  air. Musculoskeletal: Mild degenerative changes pubic symphysis and bilateral SI joints. Sagittal images of the spine shows mild degenerative changes lower thoracic spine IMPRESSION: 1. No nephrolithiasis.  No hydronephrosis or hydroureter. 2. No calcified ureteral calculi are noted. 3. Status post hysterectomy. 4. Mild degenerative changes. 5. Normal appendix. No pericecal inflammation. No small bowel obstruction. Electronically Signed   By: Lahoma Crocker M.D.   On: 05/25/2016 15:57    Procedures Procedures (including critical care time)  Medications Ordered in ED Medications  cefTRIAXone (ROCEPHIN) 1 g in dextrose 5 % 50 mL IVPB (not administered)  morphine 4 MG/ML injection 4 mg (4 mg Intravenous Given 05/25/16 1601)  ondansetron (ZOFRAN) injection 4 mg (4 mg Intravenous Given 05/25/16 1602)     Initial Impression / Assessment and Plan / ED Course  I have reviewed the triage vital signs and the nursing notes.  Pertinent labs & imaging results that were available during my care of the patient were reviewed by me  and considered in my medical decision making (see chart for details).  Clinical Course    Left flank pain of uncertain cause. Screening labs show renal insufficiency, but review of records on care everywhere show that renal function is not worse than baseline. Will check urinalysis and sent for CT renal stone protocol. She'll be given morphine for pain. NSAIDs need to be avoided because of renal disease.  CT is unremarkable. However, urinalysis shows clear evidence of infection. She feels significantly better after above noted treatment. She's given ceftriaxone for her urinary infection and discharged with prescriptions for cephalexin, oxycodone have acetaminophen, and ondansetron. Follow-up with PCP in one week.  Final Clinical Impressions(s) / ED Diagnoses   Final diagnoses:  Pyelonephritis  Renal insufficiency    New Prescriptions New Prescriptions   CEPHALEXIN (KEFLEX) 500 MG CAPSULE    Take 1 capsule (500 mg total) by mouth 4 (four) times daily.   ONDANSETRON (ZOFRAN) 4 MG TABLET    Take 1 tablet (4 mg total) by mouth every 8 (eight) hours as needed for nausea or vomiting.   OXYCODONE-ACETAMINOPHEN (PERCOCET) 5-325 MG TABLET    Take 1 tablet by mouth every 4 (four) hours as needed for moderate pain.     Delora Fuel, MD 53/20/23 3435

## 2016-05-27 LAB — URINE CULTURE

## 2017-03-23 ENCOUNTER — Encounter (HOSPITAL_COMMUNITY): Payer: Self-pay | Admitting: Emergency Medicine

## 2017-03-23 ENCOUNTER — Ambulatory Visit (HOSPITAL_COMMUNITY)
Admission: EM | Admit: 2017-03-23 | Discharge: 2017-03-23 | Disposition: A | Discharge status: Home / Self Care | Payer: Self-pay | Attending: Family Medicine | Admitting: Family Medicine

## 2017-03-23 DIAGNOSIS — M545 Low back pain, unspecified: Secondary | ICD-10-CM

## 2017-03-23 MED ORDER — METHOCARBAMOL 500 MG PO TABS: 500.0000 mg | ORAL_TABLET | Freq: Four times a day (QID) | ORAL | 0 refills | Status: DC | PRN

## 2017-03-23 MED ORDER — PREDNISONE 10 MG (21) PO TBPK
ORAL_TABLET | Freq: Every day | ORAL | 0 refills | Status: DC
Start: 2017-03-23 — End: 2018-02-26

## 2017-03-23 NOTE — Discharge Instructions (Signed)
If symptoms fail to resolve, or worsen, return to clinic as needed. In addition to the 2 medicines are prescribed, I also recommend over-the-counter Tylenol, do not take any more than 4000 mg a day of Tylenol, as this will damage your liver

## 2017-03-23 NOTE — ED Triage Notes (Signed)
PT reports she recently started a new job and does a lot of bending, lifting, and twisting. PT reports she attempted to stretch this morning. Pain radiates down both legs.

## 2017-03-23 NOTE — ED Provider Notes (Signed)
[facility]   [id number] 03/23/17 Arrival Time: 12   SUBJECTIVE:  Angela Kerr is a 56 y.o. female who presents to the urgent care with complaint of lower back pain that has been ongoing for 2-3 days. Recently started a new job that requires a lot of bending, lifting, and twisting. Pain radiates down both legs. Denies any loss of sensation, has not lost control of bowel or bladder function, no night sweats, no unexpected weight loss.     Past Medical History:  Diagnosis Date  . Hypertension    Social History   Social History  . Marital status: Divorced    Spouse name: N/A  . Number of children: N/A  . Years of education: N/A   Occupational History  . Not on file.   Social History Main Topics  . Smoking status: Never Smoker  . Smokeless tobacco: Never Used  . Alcohol use No  . Drug use: No  . Sexual activity: Yes    Birth control/ protection: Surgical   Other Topics Concern  . Not on file   Social History Narrative  . No narrative on file   No outpatient prescriptions have been marked as taking for the 03/23/17 encounter [facility] Encounter).   Allergies  Allergen Reactions  . Codeine Nausea And Vomiting  . Other Other (See Comments)    Certain B/P meds cause leg pain      ROS: As per HPI, remainder of ROS negative.   OBJECTIVE:  Vitals:   03/23/17 1818  BP: (!) 169/90  Pulse: 96  Resp: 16  Temp: 98.4 F (36.9 C)  TempSrc: Oral  SpO2: 97%  Weight: 230 lb (104.3 kg)  Height: 5\' 4"  (1.626 m)       General Appearance:  awake, alert, oriented, in no acute distress, well developed, well nourished and in no acute distress Skin:  skin color, texture, turgor are normal Head/face:  NCAT Ears:  External- normal Back:  mild pain to palpation in the LS spine midline Lungs:  Normal expansion.  Clear to auscultation.  No rales, rhonchi, or wheezing. Heart:  Heart regular rate and rhythm Abdomen:  Soft, non-tender, normal bowel  sounds; no bruits, organomegaly or masses. Musculoskeletal:  positive findings: Straight leg raise Peripheral Pulses:  Capillary refill <2secs, strong peripheral pulses Neurologic:  Alert and oriented x 3, gait normal., reflexes normal and symmetric, strength and  sensation grossly normal     Labs: Labs Reviewed - No data to display  No results found.     ASSESSMENT & PLAN:  1. Acute right-sided low back pain without sciatica     Meds ordered this encounter  Medications  . predniSONE (STERAPRED UNI-PAK 21 TAB) 10 MG (21) TBPK tablet    Sig: Take by mouth daily. Take 6 tabs by mouth daily  for 2 days, then 5 tabs for 2 days, then 4 tabs for 2 days, then 3 tabs for 2 days, 2 tabs for 2 days, then 1 tab by mouth daily for 2 days    Dispense:  42 tablet    Refill:  0    Order Specific Question:   Supervising Provider    AnswerVanessa Kick [[id number]]  . methocarbamol (ROBAXIN) 500 MG tablet    Sig: Take 1 tablet (500 mg total) by mouth every 6 (six) hours as needed for muscle spasms.    Dispense:  40 tablet    Refill:  0    Order Specific Question:  Supervising Provider    Answer:   Vanessa Kick [[id number]]   Will hold NSAIDs, as patient has stage IV renal disease treated with muscle relaxers and prednisone, recommend rest, ice alternating with heating pad, and Tylenol. Follow-up with primary care as needed  Reviewed expectations re: course of current medical issues. Questions answered. Outlined signs and symptoms indicating need for more acute intervention. Patient verbalized understanding. After Visit Summary given.    Procedures:        Barnet Glasgow, NP 03/23/17 1926

## 2017-08-20 ENCOUNTER — Other Ambulatory Visit: Payer: Self-pay

## 2017-08-20 ENCOUNTER — Ambulatory Visit (HOSPITAL_COMMUNITY)
Admission: EM | Admit: 2017-08-20 | Discharge: 2017-08-20 | Disposition: A | Discharge status: Home / Self Care | Payer: BLUE CROSS/BLUE SHIELD | Attending: Internal Medicine | Admitting: Internal Medicine

## 2017-08-20 ENCOUNTER — Encounter (HOSPITAL_COMMUNITY): Payer: Self-pay | Admitting: Emergency Medicine

## 2017-08-20 DIAGNOSIS — R809 Proteinuria, unspecified: Secondary | ICD-10-CM

## 2017-08-20 DIAGNOSIS — I10 Essential (primary) hypertension: Secondary | ICD-10-CM

## 2017-08-20 DIAGNOSIS — R109 Unspecified abdominal pain: Secondary | ICD-10-CM | POA: Diagnosis not present

## 2017-08-20 HISTORY — DX: Gout, unspecified: M10.9

## 2017-08-20 LAB — POCT URINALYSIS DIP (DEVICE)
Bilirubin Urine: NEGATIVE
GLUCOSE, UA: NEGATIVE mg/dL
Ketones, ur: NEGATIVE mg/dL
Leukocytes, UA: NEGATIVE
Nitrite: NEGATIVE
Specific Gravity, Urine: 1.02 (ref 1.005–1.030)
UROBILINOGEN UA: 0.2 mg/dL (ref 0.0–1.0)
pH: 5.5 (ref 5.0–8.0)

## 2017-08-20 NOTE — ED Provider Notes (Signed)
57 year old female with history of CKD, HTN, arthritis comes in for evaluation of left flank pain and headache.  States that she had cortisone injections in her knee, and was told that it can raise her blood pressure.  Denies chest pain, shortness of breath, weakness, dizziness, nausea, vomiting, confusion syncope, blurry vision.  At triage, she was hypertensive at 208/118, urine showed proteinuria.  Given patient with hypertension with headache and flank pain, will discharge in stable condition to the emergency department for evaluation of possible hypertensive urgency/emergency.   Ok Edwards, PA-C 08/20/17 2353

## 2017-08-20 NOTE — ED Triage Notes (Signed)
Pt states she had cortizone shots in her knee this morning, states her doctor told her it could make her blood pressure high. Pt c/o L flank pain that started a couple of hours ago. Pt also c/o headache

## 2017-08-20 NOTE — Discharge Instructions (Signed)
Your blood pressure was 208/118, there was protein in your urine, as well as having headache and flank pain, will need more evaluation in urgent care can provide.  Please go to the emergency department for further evaluation and treatment needed.

## 2017-10-12 ENCOUNTER — Emergency Department (HOSPITAL_COMMUNITY)
Admission: EM | Admit: 2017-10-12 | Discharge: 2017-10-12 | Discharge status: Against Medical Advice | Payer: BLUE CROSS/BLUE SHIELD | Attending: Emergency Medicine | Admitting: Emergency Medicine

## 2017-10-12 ENCOUNTER — Other Ambulatory Visit: Payer: Self-pay

## 2017-10-12 ENCOUNTER — Encounter (HOSPITAL_COMMUNITY): Payer: Self-pay | Admitting: *Deleted

## 2017-10-12 DIAGNOSIS — I10 Essential (primary) hypertension: Secondary | ICD-10-CM | POA: Diagnosis not present

## 2017-10-12 DIAGNOSIS — M549 Dorsalgia, unspecified: Secondary | ICD-10-CM

## 2017-10-12 DIAGNOSIS — M5489 Other dorsalgia: Secondary | ICD-10-CM | POA: Diagnosis not present

## 2017-10-12 DIAGNOSIS — Z79899 Other long term (current) drug therapy: Secondary | ICD-10-CM | POA: Diagnosis not present

## 2017-10-12 DIAGNOSIS — M25511 Pain in right shoulder: Secondary | ICD-10-CM | POA: Diagnosis present

## 2017-10-12 MED ORDER — ACETAMINOPHEN 325 MG PO TABS
650.0000 mg | ORAL_TABLET | Freq: Once | ORAL | Status: AC
  Administered 2017-10-12: 650 mg via ORAL
  Filled 2017-10-12: qty 2

## 2017-10-12 MED ORDER — LISINOPRIL 20 MG PO TABS
20.0000 mg | ORAL_TABLET | Freq: Once | ORAL | Status: AC
  Administered 2017-10-12: 20 mg via ORAL
  Filled 2017-10-12: qty 1

## 2017-10-12 NOTE — ED Notes (Signed)
Pt states right upper shoulder pain that radiates with movement since Friday.  Felt like she pulled something in the gym earlier in the week.  Pt states the pain feels like a bad pulled muscle.  Pt is tender to palpation to posterior upper right shoulder area.

## 2017-10-12 NOTE — ED Provider Notes (Signed)
[place] EMERGENCY DEPARTMENT Provider Note   CSN: [id number] Arrival date & time: 10/12/17  0453     History   Chief Complaint Chief Complaint  Patient presents with  . Back Pain    HPI Angela Kerr is a 57 y.o. female.  HPI   57 y/o female who presents to the ED c/o right posterior shoulder pain that began last week after she worked out. States she has tried using a heating pad and ibuprofen with no relief. States pain is 10/10. Pain feels like stabbing pain. Pain is constant and worse with movement. She states she had been lifting weights and working with a Physiological scientist for the last month.  States she went to work yesterday and feels that pain got worse after working because she does a lot of heavy lifting at her job.  She denies chest pain, sob, headaches, lightheadedness, dizziness, vision changes, decreased urine output, abd pain, lower back pain.  States she took her labetolol this AM, but did not take her lisinopril.   Past Medical History:  Diagnosis Date  . Gout   . Hypertension   . Renal disorder     There are no active problems to display for this patient.   Past Surgical History:  Procedure Laterality Date  . ABDOMINAL HYSTERECTOMY       OB History   None      Home Medications    Prior to Admission medications   Medication Sig Start Date End Date Taking? Authorizing Provider  acetaminophen (TYLENOL) 325 MG tablet Take 325-650 mg by mouth every 6 (six) hours as needed (for back pain).    [provider]  albuterol (PROVENTIL HFA;VENTOLIN HFA) 108 (90 BASE) MCG/ACT inhaler Inhale 1-2 puffs into the lungs every 6 (six) hours as needed for wheezing. Patient not taking: Reported on 05/25/2016 11/09/12   Moreno-Coll, Adlih, MD  allopurinol (ZYLOPRIM) 100 MG tablet Take 100 mg by mouth daily as needed (for gout flares).  05/19/16 05/19/17  [provider]  colchicine 0.6 MG tablet Take 2 now and 1 in 1  hour.  May repeat dose once daily.  For gout attack. Patient not taking: Reported on 08/20/2017 08/02/12   Harden Mo, MD  furosemide (LASIX) 20 MG tablet Take 20 mg by mouth daily.    [provider]  labetalol (NORMODYNE) 300 MG tablet Take 300 mg by mouth 2 (two) times daily. 05/18/16 05/18/17  [provider]  lisinopril (PRINIVIL,ZESTRIL) 20 MG tablet Take 1 tablet (20 mg total) by mouth daily. 11/09/12   Moreno-Coll, Adlih, MD  methocarbamol (ROBAXIN) 500 MG tablet Take 1 tablet (500 mg total) by mouth every 6 (six) hours as needed for muscle spasms. Patient not taking: Reported on 08/20/2017 03/23/17   Barnet Glasgow, NP  Multiple Vitamin (THERA) TABS Take 1 tablet by mouth daily.    [provider]  ondansetron (ZOFRAN) 4 MG tablet Take 1 tablet (4 mg total) by mouth every 8 (eight) hours as needed for nausea or vomiting. Patient not taking: Reported on 4/0/1027 25/3/66   Delora Fuel, MD  predniSONE (STERAPRED UNI-PAK 21 TAB) 10 MG (21) TBPK tablet Take by mouth daily. Take 6 tabs by mouth daily  for 2 days, then 5 tabs for 2 days, then 4 tabs for 2 days, then 3 tabs for 2 days, 2 tabs for 2 days, then 1 tab by mouth daily for 2 days Patient not taking: Reported on 08/20/2017 03/23/17  Barnet Glasgow, NP    Family History No family history on file.  Social History Social History   Tobacco Use  . Smoking status: Never Smoker  . Smokeless tobacco: Never Used  Substance Use Topics  . Alcohol use: No  . Drug use: No     Allergies   Codeine and Other   Review of Systems Review of Systems  Constitutional: Negative for fever.  HENT: Negative for sore throat.   Respiratory: Negative for shortness of breath.   Cardiovascular: Negative for chest pain.  Gastrointestinal: Negative for constipation, diarrhea, nausea and vomiting.  Genitourinary: Negative for decreased urine volume.  Musculoskeletal:       Bilat shoulder pain  Skin: Negative for rash.    Neurological: Negative for dizziness, weakness, light-headedness, numbness and headaches.     Physical Exam Updated Vital Signs BP (!) 193/100 (BP Location: Left Arm)   Pulse 69   Temp (!) 97.4 F (36.3 C) (Oral)   Resp 18   Ht 5\' 4"  (1.626 m)   Wt 100.7 kg (222 lb)   SpO2 100%   BMI 38.11 kg/m   Physical Exam  Constitutional: She is oriented to person, place, and time. She appears well-developed and well-nourished. No distress.  HENT:  Head: Normocephalic and atraumatic.  Eyes: Conjunctivae are normal.  Neck: Normal range of motion. Neck supple.  Cardiovascular: Normal rate, regular rhythm, normal heart sounds and intact distal pulses.  No murmur heard. Radial and DP pulses intact and symmetric bilaterally   Pulmonary/Chest: Effort normal and breath sounds normal. No respiratory distress. She has no wheezes.  Abdominal: Soft. Bowel sounds are normal. There is no tenderness.  Musculoskeletal: She exhibits no edema.  No midline ttp. Patient has tenderness to right posterior shoulder and trapezius muscle which reproduces her pain.  Full range of motion of bilateral shoulders.  No erythema or warmth to bilateral shoulders.  Flexion of shoulder and crossover of right shoulder to left side reproduces pain. 5/5 strength to BUE with strong and equal grip strength.   Neurological: She is alert and oriented to person, place, and time. No cranial nerve deficit.  Skin: Skin is warm and dry.  Psychiatric: She has a normal mood and affect.  Nursing note and vitals reviewed.    ED Treatments / Results  Labs (all labs ordered are listed, but only abnormal results are displayed) Labs Reviewed - No data to display  EKG None  Radiology No results found.  Procedures Procedures (including critical care time)  Medications Ordered in ED Medications  lisinopril (PRINIVIL,ZESTRIL) tablet 20 mg (20 mg Oral Given 10/12/17 0937)  acetaminophen (TYLENOL) tablet 650 mg (650 mg Oral Given  10/12/17 0937)     Initial Impression / Assessment and Plan / ED Course  I have reviewed the triage vital signs and the nursing notes.  Pertinent labs & imaging results that were available during my care of the patient were reviewed by me and considered in my medical decision making (see chart for details).    suspect that pts symptoms are musculoskeletal given history and reproducibility with palpation over trapezius and upper scapular musculature. Advised pt that I would give tylenol to see if symptoms improve. Advised that I would also give her other dose of BP meds prior to discharge as bp is very high. She is asymptomatic and I doubt HTN emergency/urgency. Pt adamantly requesting something stronger for pain other than tylenol. Pt has CKD and cannot have NSAIDs. Advised that I cannot give anything  stronger in ED for pain as she is driving home, but stated that I can give her a RX for stronger medication to go home with. Pt became upset when I explained this to her, but was eventually agreeable with plan to be discharged with rx for meds.   Prior to discharge pt eloped.   Final Clinical Impressions(s) / ED Diagnoses   Final diagnoses:  Back pain, unspecified back location, unspecified back pain laterality, unspecified chronicity     ED Discharge Orders    None       Rodney Booze, PA-C 10/12/17 1718    Pattricia Boss, MD 10/13/17 [phone]

## 2017-10-12 NOTE — ED Triage Notes (Addendum)
To ED for eval of right shoulder/upper back pain for a few days. States she recently started working out and feels like this started while using a sit-up machine. States this feels like a pinched nerve. Pain increased with movement of right arm or movement of neck. Has tried heating pad and otc meds without relief

## 2017-10-12 NOTE — ED Notes (Signed)
Patient not in room or in bathroom. Provider notified.

## 2018-02-26 ENCOUNTER — Encounter (HOSPITAL_COMMUNITY): Payer: Self-pay | Admitting: *Deleted

## 2018-02-26 ENCOUNTER — Ambulatory Visit (HOSPITAL_COMMUNITY)
Admission: EM | Admit: 2018-02-26 | Discharge: 2018-02-26 | Disposition: A | Discharge status: Home / Self Care | Payer: BLUE CROSS/BLUE SHIELD | Attending: Internal Medicine | Admitting: Internal Medicine

## 2018-02-26 DIAGNOSIS — M25571 Pain in right ankle and joints of right foot: Secondary | ICD-10-CM

## 2018-02-26 MED ORDER — PREDNISONE 10 MG (21) PO TBPK: ORAL_TABLET | Freq: Every day | ORAL | 0 refills | Status: DC

## 2018-02-26 NOTE — Discharge Instructions (Addendum)
Continue to use compression stockings and keep leg elevated

## 2018-02-26 NOTE — ED Provider Notes (Signed)
[place]    CSN: [id number] Arrival date & time: 02/26/18  1620     History   Chief Complaint Chief Complaint  Patient presents with  . Ankle Pain    HPI Angela Kerr is a 57 y.o. female.   57 y.o. female presents with right foot swelling and pain x5 days.  She denies any cardiovascular irregularities however does endorse seeing a nephrologist for kidney issues.  Patient denies any trauma but states that she was working standing on her feet. Condition is acute in nature. Condition is made better by nothing. Condition is made worse by weightbearing activities. Patient denies any relief from depression stockings applied prior to there arrival at this facility.       Past Medical History:  Diagnosis Date  . Gout   . Hypertension   . Renal disorder     There are no active problems to display for this patient.   Past Surgical History:  Procedure Laterality Date  . ABDOMINAL HYSTERECTOMY      OB History   None      Home Medications    Prior to Admission medications   Medication Sig Start Date End Date Taking? Authorizing Provider  acetaminophen (TYLENOL) 325 MG tablet Take 325-650 mg by mouth every 6 (six) hours as needed (for back pain).   Yes [provider]  allopurinol (ZYLOPRIM) 100 MG tablet Take 100 mg by mouth daily as needed (for gout flares).  05/19/16 02/26/18 Yes [provider]  furosemide (LASIX) 20 MG tablet Take 20 mg by mouth daily.   Yes [provider]  labetalol (NORMODYNE) 300 MG tablet Take 300 mg by mouth 2 (two) times daily. 05/18/16 02/26/18 Yes [provider]  albuterol (PROVENTIL HFA;VENTOLIN HFA) 108 (90 BASE) MCG/ACT inhaler Inhale 1-2 puffs into the lungs every 6 (six) hours as needed for wheezing. Patient not taking: Reported on 05/25/2016 11/09/12   Moreno-Coll, Leonette Monarch, MD    Family History Family History  Problem Relation Age of Onset  . Alcoholism Mother     Social  History Social History   Tobacco Use  . Smoking status: Never Smoker  . Smokeless tobacco: Never Used  Substance Use Topics  . Alcohol use: No  . Drug use: No     Allergies   Codeine and Other   Review of Systems Review of Systems  Constitutional: Negative for chills and fever.  HENT: Negative for ear pain and sore throat.   Eyes: Negative for pain and visual disturbance.  Respiratory: Negative for cough and shortness of breath.   Cardiovascular: Negative for chest pain and palpitations.  Gastrointestinal: Negative for abdominal pain and vomiting.  Genitourinary: Negative for dysuria and hematuria.  Musculoskeletal: Negative for arthralgias and back pain.       Welling and pain to right ankle  Skin: Negative for color change and rash.  Neurological: Negative for seizures and syncope.  All other systems reviewed and are negative.    Physical Exam Triage Vital Signs ED Triage Vitals  Enc Vitals Group     BP 02/26/18 1716 (!) 184/107     Pulse Rate 02/26/18 1716 93     Resp 02/26/18 1716 18     Temp 02/26/18 1716 97.9 F (36.6 C)     Temp Source 02/26/18 1716 Oral     SpO2 02/26/18 1716 100 %     Weight --      Height --      Head Circumference --  Peak Flow --      Pain Score 02/26/18 1717 8     Pain Loc --      Pain Edu? --      Excl. in [place]? --    No data found.  Updated Vital Signs BP (!) 184/107 Comment: took HTN med approx 1 hr ago  Pulse 93   Temp 97.9 F (36.6 C) (Oral)   Resp 18   SpO2 100%   Visual Acuity Right Eye Distance:   Left Eye Distance:   Bilateral Distance:    Right Eye Near:   Left Eye Near:    Bilateral Near:     Physical Exam  Constitutional: She is oriented to person, place, and time. She appears well-developed and well-nourished.  HENT:  Head: Normocephalic and atraumatic.  Eyes: Conjunctivae are normal.  Neck: Normal range of motion.  Pulmonary/Chest: Effort normal.  Neurological: She is alert and oriented to  person, place, and time.  Skin: Skin is warm. No rash noted. No erythema.  Swelling noted to right ankle.  Psychiatric: She has a normal mood and affect.  Nursing note and vitals reviewed.    UC Treatments / Results  Labs (all labs ordered are listed, but only abnormal results are displayed) Labs Reviewed - No data to display  EKG None  Radiology No results found.  Procedures Procedures (including critical care time)  Medications Ordered in UC Medications - No data to display  Initial Impression / Assessment and Plan / UC Course  I have reviewed the triage vital signs and the nursing notes.  Pertinent labs & imaging results that were available during my care of the patient were reviewed by me and considered in my medical decision making (see chart for details).      Final Clinical Impressions(s) / UC Diagnoses   Final diagnoses:  None   Discharge Instructions   None    ED Prescriptions    None     Controlled Substance Prescriptions [place] Controlled Substance Registry consulted? Not Applicable   Jacqualine Mau, NP 02/26/18 1755

## 2018-02-26 NOTE — ED Triage Notes (Signed)
Denies injury.  C/O starting with right ankle pain and swelling 5 days ago.

## 2018-04-01 ENCOUNTER — Other Ambulatory Visit: Payer: Self-pay | Admitting: Ophthalmology

## 2018-04-01 DIAGNOSIS — H47019 Ischemic optic neuropathy, unspecified eye: Secondary | ICD-10-CM

## 2018-05-13 ENCOUNTER — Other Ambulatory Visit: Payer: Self-pay | Admitting: *Deleted

## 2018-05-13 DIAGNOSIS — N186 End stage renal disease: Secondary | ICD-10-CM

## 2018-05-23 ENCOUNTER — Other Ambulatory Visit: Payer: Self-pay

## 2018-05-23 ENCOUNTER — Emergency Department (HOSPITAL_COMMUNITY): Payer: Medicaid Other

## 2018-05-23 ENCOUNTER — Inpatient Hospital Stay (HOSPITAL_COMMUNITY)
Admission: EM | Admit: 2018-05-23 | Discharge: 2018-05-27 | DRG: 871 | Disposition: A | Discharge status: Home / Self Care | Payer: Medicaid Other | Attending: Internal Medicine | Admitting: Internal Medicine

## 2018-05-23 ENCOUNTER — Encounter (HOSPITAL_COMMUNITY): Payer: Self-pay | Admitting: Emergency Medicine

## 2018-05-23 DIAGNOSIS — N189 Chronic kidney disease, unspecified: Secondary | ICD-10-CM

## 2018-05-23 DIAGNOSIS — A419 Sepsis, unspecified organism: Secondary | ICD-10-CM | POA: Diagnosis present

## 2018-05-23 DIAGNOSIS — M898X9 Other specified disorders of bone, unspecified site: Secondary | ICD-10-CM | POA: Diagnosis present

## 2018-05-23 DIAGNOSIS — I10 Essential (primary) hypertension: Secondary | ICD-10-CM

## 2018-05-23 DIAGNOSIS — M109 Gout, unspecified: Secondary | ICD-10-CM | POA: Diagnosis present

## 2018-05-23 DIAGNOSIS — Z79899 Other long term (current) drug therapy: Secondary | ICD-10-CM | POA: Diagnosis not present

## 2018-05-23 DIAGNOSIS — Z90710 Acquired absence of both cervix and uterus: Secondary | ICD-10-CM | POA: Diagnosis not present

## 2018-05-23 DIAGNOSIS — J9601 Acute respiratory failure with hypoxia: Secondary | ICD-10-CM | POA: Diagnosis present

## 2018-05-23 DIAGNOSIS — D631 Anemia in chronic kidney disease: Secondary | ICD-10-CM | POA: Diagnosis present

## 2018-05-23 DIAGNOSIS — Z9049 Acquired absence of other specified parts of digestive tract: Secondary | ICD-10-CM | POA: Diagnosis not present

## 2018-05-23 DIAGNOSIS — R6510 Systemic inflammatory response syndrome (SIRS) of non-infectious origin without acute organ dysfunction: Secondary | ICD-10-CM | POA: Diagnosis present

## 2018-05-23 DIAGNOSIS — D709 Neutropenia, unspecified: Secondary | ICD-10-CM

## 2018-05-23 DIAGNOSIS — N186 End stage renal disease: Secondary | ICD-10-CM | POA: Diagnosis present

## 2018-05-23 DIAGNOSIS — Z888 Allergy status to other drugs, medicaments and biological substances status: Secondary | ICD-10-CM | POA: Diagnosis not present

## 2018-05-23 DIAGNOSIS — N2581 Secondary hyperparathyroidism of renal origin: Secondary | ICD-10-CM | POA: Diagnosis present

## 2018-05-23 DIAGNOSIS — R5081 Fever presenting with conditions classified elsewhere: Secondary | ICD-10-CM

## 2018-05-23 DIAGNOSIS — Z885 Allergy status to narcotic agent status: Secondary | ICD-10-CM

## 2018-05-23 DIAGNOSIS — Z992 Dependence on renal dialysis: Secondary | ICD-10-CM | POA: Diagnosis not present

## 2018-05-23 DIAGNOSIS — I120 Hypertensive chronic kidney disease with stage 5 chronic kidney disease or end stage renal disease: Secondary | ICD-10-CM | POA: Diagnosis present

## 2018-05-23 DIAGNOSIS — H5461 Unqualified visual loss, right eye, normal vision left eye: Secondary | ICD-10-CM | POA: Diagnosis present

## 2018-05-23 DIAGNOSIS — R509 Fever, unspecified: Secondary | ICD-10-CM

## 2018-05-23 DIAGNOSIS — B349 Viral infection, unspecified: Secondary | ICD-10-CM | POA: Diagnosis present

## 2018-05-23 HISTORY — DX: Dependence on renal dialysis: Z99.2

## 2018-05-23 HISTORY — DX: End stage renal disease: N18.6

## 2018-05-23 HISTORY — DX: Unqualified visual loss, right eye, normal vision left eye: H54.61

## 2018-05-23 LAB — COMPREHENSIVE METABOLIC PANEL
ALT: 20 U/L (ref 0–44)
ANION GAP: 17 — AB (ref 5–15)
AST: 37 U/L (ref 15–41)
Albumin: 3.3 g/dL — ABNORMAL LOW (ref 3.5–5.0)
Alkaline Phosphatase: 67 U/L (ref 38–126)
BUN: 67 mg/dL — ABNORMAL HIGH (ref 6–20)
CHLORIDE: 89 mmol/L — AB (ref 98–111)
CO2: 23 mmol/L (ref 22–32)
Calcium: 7.8 mg/dL — ABNORMAL LOW (ref 8.9–10.3)
Creatinine, Ser: 12.36 mg/dL — ABNORMAL HIGH (ref 0.44–1.00)
GFR calc non Af Amer: 3 mL/min — ABNORMAL LOW (ref >60)
GFR, EST AFRICAN AMERICAN: 3 mL/min — AB (ref >60)
Glucose, Bld: 102 mg/dL — ABNORMAL HIGH (ref 70–99)
Potassium: 4.1 mmol/L (ref 3.5–5.1)
SODIUM: 129 mmol/L — AB (ref 135–145)
Total Bilirubin: 0.7 mg/dL (ref 0.3–1.2)
Total Protein: 6.2 g/dL — ABNORMAL LOW (ref 6.5–8.1)

## 2018-05-23 LAB — URINALYSIS, ROUTINE W REFLEX MICROSCOPIC
Bilirubin Urine: NEGATIVE
GLUCOSE, UA: NEGATIVE mg/dL
KETONES UR: NEGATIVE mg/dL
LEUKOCYTES UA: NEGATIVE
Nitrite: NEGATIVE
Specific Gravity, Urine: 1.008 (ref 1.005–1.030)
pH: 8 (ref 5.0–8.0)

## 2018-05-23 LAB — CBC
HEMATOCRIT: 27.5 % — AB (ref 36.0–46.0)
Hemoglobin: 8.8 g/dL — ABNORMAL LOW (ref 12.0–15.0)
MCH: 28.4 pg (ref 26.0–34.0)
MCHC: 32 g/dL (ref 30.0–36.0)
MCV: 88.7 fL (ref 80.0–100.0)
NRBC: 0 % (ref 0.0–0.2)
Platelets: 245 10*3/uL (ref 150–400)
RBC: 3.1 MIL/uL — AB (ref 3.87–5.11)
RDW: 12.5 % (ref 11.5–15.5)
WBC: 2.7 10*3/uL — ABNORMAL LOW (ref 4.0–10.5)

## 2018-05-23 LAB — I-STAT CG4 LACTIC ACID, ED: LACTIC ACID, VENOUS: 0.55 mmol/L (ref 0.5–1.9)

## 2018-05-23 LAB — RESPIRATORY PANEL BY PCR
ADENOVIRUS-RVPPCR: NOT DETECTED
BORDETELLA PERTUSSIS-RVPCR: NOT DETECTED
CHLAMYDOPHILA PNEUMONIAE-RVPPCR: NOT DETECTED
Coronavirus 229E: NOT DETECTED
Coronavirus HKU1: NOT DETECTED
Coronavirus NL63: NOT DETECTED
Coronavirus OC43: NOT DETECTED
INFLUENZA A-RVPPCR: NOT DETECTED
Influenza B: NOT DETECTED
Metapneumovirus: NOT DETECTED
Mycoplasma pneumoniae: NOT DETECTED
PARAINFLUENZA VIRUS 1-RVPPCR: NOT DETECTED
PARAINFLUENZA VIRUS 3-RVPPCR: NOT DETECTED
PARAINFLUENZA VIRUS 4-RVPPCR: NOT DETECTED
Parainfluenza Virus 2: NOT DETECTED
RESPIRATORY SYNCYTIAL VIRUS-RVPPCR: NOT DETECTED
RHINOVIRUS / ENTEROVIRUS - RVPPCR: NOT DETECTED

## 2018-05-23 LAB — INFLUENZA PANEL BY PCR (TYPE A & B)
INFLAPCR: NEGATIVE
Influenza B By PCR: NEGATIVE

## 2018-05-23 LAB — PROCALCITONIN: Procalcitonin: 0.88 ng/mL

## 2018-05-23 LAB — LIPASE, BLOOD: LIPASE: 118 U/L — AB (ref 11–51)

## 2018-05-23 MED ORDER — PENTAFLUOROPROP-TETRAFLUOROETH EX AERO: 1.0000 "application " | INHALATION_SPRAY | CUTANEOUS | Status: DC | PRN

## 2018-05-23 MED ORDER — LIDOCAINE HCL (PF) 1 % IJ SOLN: 5.0000 mL | INTRAMUSCULAR | Status: DC | PRN

## 2018-05-23 MED ORDER — DARBEPOETIN ALFA 60 MCG/0.3ML IJ SOSY
60.0000 ug | PREFILLED_SYRINGE | INTRAMUSCULAR | Status: DC
  Administered 2018-05-23: 60 ug via INTRAVENOUS
  Filled 2018-05-23: qty 0.3

## 2018-05-23 MED ORDER — SORBITOL 70 % SOLN
30.0000 mL | Status: DC | PRN
  Filled 2018-05-23: qty 300

## 2018-05-23 MED ORDER — FENTANYL CITRATE (PF) 100 MCG/2ML IJ SOLN: 25.0000 ug | Freq: Once | INTRAMUSCULAR | Status: DC | PRN

## 2018-05-23 MED ORDER — METRONIDAZOLE IN NACL 5-0.79 MG/ML-% IV SOLN
500.0000 mg | Freq: Three times a day (TID) | INTRAVENOUS | Status: DC
  Administered 2018-05-23 – 2018-05-25 (×5): 500 mg via INTRAVENOUS
  Filled 2018-05-23 (×6): qty 100

## 2018-05-23 MED ORDER — ONDANSETRON HCL 4 MG/2ML IJ SOLN
4.0000 mg | Freq: Four times a day (QID) | INTRAMUSCULAR | Status: DC | PRN
  Administered 2018-05-25: 4 mg via INTRAVENOUS
  Filled 2018-05-23: qty 2

## 2018-05-23 MED ORDER — SODIUM CHLORIDE 0.9 % IV SOLN
100.0000 mL | INTRAVENOUS | Status: DC | PRN
  Administered 2018-05-25: 100 mL via INTRAVENOUS

## 2018-05-23 MED ORDER — HEPARIN SODIUM (PORCINE) 1000 UNIT/ML IJ SOLN
INTRAMUSCULAR | Status: AC
  Administered 2018-05-23: 1000 [IU] via INTRAVENOUS_CENTRAL
  Filled 2018-05-23: qty 4

## 2018-05-23 MED ORDER — ALBUTEROL SULFATE (2.5 MG/3ML) 0.083% IN NEBU
3.0000 mL | INHALATION_SOLUTION | Freq: Four times a day (QID) | RESPIRATORY_TRACT | Status: DC | PRN
  Administered 2018-05-23: 3 mL via RESPIRATORY_TRACT
  Filled 2018-05-23: qty 3
  Filled 2018-05-23: qty 39

## 2018-05-23 MED ORDER — ACETAMINOPHEN 650 MG RE SUPP: 650.0000 mg | Freq: Four times a day (QID) | RECTAL | Status: DC | PRN

## 2018-05-23 MED ORDER — HYDRALAZINE HCL 20 MG/ML IJ SOLN: 5.0000 mg | INTRAMUSCULAR | Status: DC | PRN

## 2018-05-23 MED ORDER — LIDOCAINE-PRILOCAINE 2.5-2.5 % EX CREA
1.0000 "application " | TOPICAL_CREAM | CUTANEOUS | Status: DC | PRN
  Filled 2018-05-23: qty 25

## 2018-05-23 MED ORDER — CALCIUM CARBONATE ANTACID 1250 MG/5ML PO SUSP
500.0000 mg | Freq: Four times a day (QID) | ORAL | Status: DC | PRN
  Filled 2018-05-23: qty 200

## 2018-05-23 MED ORDER — ONDANSETRON HCL 4 MG PO TABS: 4.0000 mg | ORAL_TABLET | Freq: Four times a day (QID) | ORAL | Status: DC | PRN

## 2018-05-23 MED ORDER — SODIUM CHLORIDE 0.9 % IV SOLN
2.0000 g | Freq: Once | INTRAVENOUS | Status: AC
  Administered 2018-05-23: 2 g via INTRAVENOUS
  Filled 2018-05-23: qty 2

## 2018-05-23 MED ORDER — DARBEPOETIN ALFA 60 MCG/0.3ML IJ SOSY
PREFILLED_SYRINGE | INTRAMUSCULAR | Status: AC
  Administered 2018-05-23: 60 ug via INTRAVENOUS
  Filled 2018-05-23: qty 0.3

## 2018-05-23 MED ORDER — CALCITRIOL 0.5 MCG PO CAPS
0.5000 ug | ORAL_CAPSULE | ORAL | Status: DC
  Administered 2018-05-25 – 2018-05-27 (×2): 0.5 ug via ORAL

## 2018-05-23 MED ORDER — DOCUSATE SODIUM 283 MG RE ENEM
1.0000 | ENEMA | RECTAL | Status: DC | PRN
  Filled 2018-05-23: qty 1

## 2018-05-23 MED ORDER — SODIUM CHLORIDE 0.9 % IV SOLN
1.0000 g | INTRAVENOUS | Status: DC
  Administered 2018-05-24 – 2018-05-25 (×2): 1 g via INTRAVENOUS
  Filled 2018-05-23 (×2): qty 1

## 2018-05-23 MED ORDER — CHLORHEXIDINE GLUCONATE CLOTH 2 % EX PADS
6.0000 | MEDICATED_PAD | Freq: Every day | CUTANEOUS | Status: DC
  Administered 2018-05-24 – 2018-05-26 (×3): 6 via TOPICAL

## 2018-05-23 MED ORDER — ALTEPLASE 2 MG IJ SOLR: 2.0000 mg | Freq: Once | INTRAMUSCULAR | Status: DC | PRN

## 2018-05-23 MED ORDER — RENA-VITE PO TABS
1.0000 | ORAL_TABLET | Freq: Every day | ORAL | Status: DC
  Administered 2018-05-24: 1 via ORAL
  Filled 2018-05-23 (×2): qty 1

## 2018-05-23 MED ORDER — ACETAMINOPHEN 325 MG PO TABS
650.0000 mg | ORAL_TABLET | Freq: Four times a day (QID) | ORAL | Status: DC | PRN
  Administered 2018-05-26: 650 mg via ORAL
  Filled 2018-05-23: qty 2

## 2018-05-23 MED ORDER — VANCOMYCIN HCL IN DEXTROSE 1-5 GM/200ML-% IV SOLN
1000.0000 mg | INTRAVENOUS | Status: DC
  Filled 2018-05-23: qty 200

## 2018-05-23 MED ORDER — VANCOMYCIN HCL 10 G IV SOLR
2000.0000 mg | Freq: Once | INTRAVENOUS | Status: AC
  Administered 2018-05-23: 2000 mg via INTRAVENOUS
  Filled 2018-05-23: qty 2000

## 2018-05-23 MED ORDER — NEPRO/CARBSTEADY PO LIQD
237.0000 mL | Freq: Three times a day (TID) | ORAL | Status: DC | PRN
  Filled 2018-05-23: qty 237

## 2018-05-23 MED ORDER — ENOXAPARIN SODIUM 30 MG/0.3ML ~~LOC~~ SOLN
30.0000 mg | SUBCUTANEOUS | Status: DC
  Administered 2018-05-23: 30 mg via SUBCUTANEOUS
  Filled 2018-05-23: qty 0.3

## 2018-05-23 MED ORDER — FENTANYL CITRATE (PF) 100 MCG/2ML IJ SOLN
50.0000 ug | Freq: Once | INTRAMUSCULAR | Status: AC
  Administered 2018-05-23: 50 ug via INTRAVENOUS
  Filled 2018-05-23: qty 2

## 2018-05-23 MED ORDER — CAMPHOR-MENTHOL 0.5-0.5 % EX LOTN
1.0000 "application " | TOPICAL_LOTION | Freq: Three times a day (TID) | CUTANEOUS | Status: DC | PRN
  Filled 2018-05-23: qty 222

## 2018-05-23 MED ORDER — ONDANSETRON HCL 4 MG/2ML IJ SOLN
4.0000 mg | Freq: Once | INTRAMUSCULAR | Status: AC
  Administered 2018-05-23: 4 mg via INTRAVENOUS
  Filled 2018-05-23: qty 2

## 2018-05-23 MED ORDER — HEPARIN SODIUM (PORCINE) 1000 UNIT/ML DIALYSIS
1000.0000 [IU] | INTRAMUSCULAR | Status: DC | PRN
  Administered 2018-05-23: 1000 [IU] via INTRAVENOUS_CENTRAL
  Filled 2018-05-23 (×2): qty 1

## 2018-05-23 MED ORDER — HEPARIN SODIUM (PORCINE) 1000 UNIT/ML DIALYSIS
20.0000 [IU]/kg | INTRAMUSCULAR | Status: DC | PRN
  Administered 2018-05-23: 1700 [IU] via INTRAVENOUS_CENTRAL
  Filled 2018-05-23 (×2): qty 2

## 2018-05-23 MED ORDER — ZOLPIDEM TARTRATE 5 MG PO TABS: 5.0000 mg | ORAL_TABLET | Freq: Every evening | ORAL | Status: DC | PRN

## 2018-05-23 MED ORDER — HYDROXYZINE HCL 25 MG PO TABS: 25.0000 mg | ORAL_TABLET | Freq: Three times a day (TID) | ORAL | Status: DC | PRN

## 2018-05-23 MED ORDER — LABETALOL HCL 300 MG PO TABS
300.0000 mg | ORAL_TABLET | Freq: Two times a day (BID) | ORAL | Status: DC
  Administered 2018-05-23 – 2018-05-27 (×8): 300 mg via ORAL
  Filled 2018-05-23 (×9): qty 1

## 2018-05-23 MED ORDER — SODIUM CHLORIDE 0.9% FLUSH
3.0000 mL | Freq: Two times a day (BID) | INTRAVENOUS | Status: DC
  Administered 2018-05-23 – 2018-05-27 (×7): 3 mL via INTRAVENOUS

## 2018-05-23 MED ORDER — SODIUM CHLORIDE 0.9 % IV SOLN: 100.0000 mL | INTRAVENOUS | Status: DC | PRN

## 2018-05-23 MED ORDER — HEPARIN SODIUM (PORCINE) 1000 UNIT/ML IJ SOLN
INTRAMUSCULAR | Status: AC
  Administered 2018-05-23: 1700 [IU] via INTRAVENOUS_CENTRAL
  Filled 2018-05-23: qty 2

## 2018-05-23 NOTE — Consult Note (Signed)
[facility] Renal Consultation Note    Indication for Consultation:  Management of ESRD/hemodialysis; anemia, hypertension/volume and secondary hyperparathyroidism PCP: Nena Polio, NP  HPI: Angela Kerr is a 57 y.o. 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  PMHx also significant for Fe def anemia, gout, SHPT.  Last month on 05/09/18 at her outpatient HD units she was noted to have had persistent drainage from her [facility] that was cultured but ultimately showed no growth.  She was treated at that time with Vanc and Tressie Ellis for a week.  Still had some drainage noted 10/28 but no purulence or redness.    Reports starting to feel bad at the end of dialysis Friday.  She was very cold.  She has had no problems with dialysis treatments.  She went home and went to bed where she has been since she came to the ED early this am.  She denies N, V but has had diarrhea.  No cough or CP.  Eyeballs hurt in the back of their sockets. She is anorexic and has had some diarrhea.  She originally did not have any aches or pains but now has joint pains.  She has no neck pain, URI sx, wounds anywhere, no dysuria, not around anyone sick except the dialysis center.  No TDC tenderness but it does itch around the exit site. Pre and post temps Friday were normal. She left slightly below edw at 89.2  Past Medical History:  Diagnosis Date  . ESRD (end stage renal disease) on dialysis ([place])   . Gout   . Hypertension   . Vision loss of right eye    within the last 6 months, seeing retina specialist   Past Surgical History:  Procedure Laterality Date  . ABDOMINAL HYSTERECTOMY    . CHOLECYSTECTOMY     Family History  Problem Relation Age of Onset  . Alcoholism Mother    Social History:  reports that she has never smoked. She has never used smokeless tobacco. She reports that she does not drink alcohol or  use drugs. Allergies  Allergen Reactions  . Codeine Nausea And Vomiting  . Other Other (See Comments)    Certain B/P meds cause leg pain   Prior to Admission medications   Medication Sig Start Date End Date Taking? Authorizing Provider  allopurinol (ZYLOPRIM) 100 MG tablet Take 100 mg by mouth daily as needed (for gout flares).  05/19/16 05/23/26 Yes [provider]  furosemide (LASIX) 20 MG tablet Take 20 mg by mouth daily.   Yes [provider]  labetalol (NORMODYNE) 300 MG tablet Take 300 mg by mouth 2 (two) times daily. 05/18/16 05/23/26 Yes [provider]  albuterol (PROVENTIL HFA;VENTOLIN HFA) 108 (90 BASE) MCG/ACT inhaler Inhale 1-2 puffs into the lungs every 6 (six) hours as needed for wheezing. Patient not taking: Reported on 05/25/2016 11/09/12   Moreno-Coll, Leonette Monarch, MD   Current Facility-Administered Medications  Medication Dose Route Frequency Provider Last Rate Last Dose  . acetaminophen (TYLENOL) tablet 650 mg  650 mg Oral Q6H PRN Karmen Bongo, MD       Or  . acetaminophen (TYLENOL) suppository 650 mg  650 mg Rectal Q6H PRN Karmen Bongo, MD      . albuterol (PROVENTIL) (2.5 MG/3ML) 0.083% nebulizer solution 3 mL  3 mL Inhalation Q6H PRN Karmen Bongo, MD      . calcitRIOL (ROCALTROL) capsule 0.5 mcg  0.5 mcg Oral Q M,W,F-HD Alric Seton, PA-C      . calcium carbonate (dosed in mg elemental calcium) suspension 500 mg of elemental calcium  500 mg of elemental calcium Oral Q6H PRN Karmen Bongo, MD      . camphor-menthol [facility]) lotion 1 application  1 application Topical W4R PRN Karmen Bongo, MD       And  . hydrOXYzine (ATARAX/VISTARIL) tablet 25 mg  25 mg Oral Q8H PRN Karmen Bongo, MD      . Derrill Memo ON 05/24/2018] ceFEPIme (MAXIPIME) 1 g in sodium chloride 0.9 % 100 mL IVPB  1 g Intravenous Q24H MastersJake Church, RPH      . [START ON 05/24/2018] Chlorhexidine Gluconate Cloth 2 % PADS 6 each  6 each Topical Q0600 Alric Seton, PA-C       . Darbepoetin Alfa (ARANESP) injection 60 mcg  60 mcg Intravenous Q Mon-HD Alric Seton, PA-C      . docusate sodium (ENEMEEZ) enema 283 mg  1 enema Rectal PRN Karmen Bongo, MD      . enoxaparin (LOVENOX) injection 30 mg  30 mg Subcutaneous Q24H Karmen Bongo, MD      . feeding supplement (NEPRO CARB STEADY) liquid 237 mL  237 mL Oral TID PRN Karmen Bongo, MD      . labetalol (NORMODYNE) tablet 300 mg  300 mg Oral BID Karmen Bongo, MD   300 mg at 05/23/18 1114  . metroNIDAZOLE (FLAGYL) IVPB 500 mg  500 mg Intravenous Lynne Logan, MD 100 mL/hr at 05/23/18 1131 500 mg at 05/23/18 1131  . multivitamin (RENA-VIT) tablet 1 tablet  1 tablet Oral QHS Alric Seton, PA-C      . ondansetron [facility]) tablet 4 mg  4 mg Oral Q6H PRN Karmen Bongo, MD       Or  . ondansetron [facility]) injection 4 mg  4 mg Intravenous Q6H PRN Karmen Bongo, MD      . sodium chloride flush (NS) 0.9 % injection 3 mL  3 mL Intravenous Q12H Karmen Bongo, MD      . sorbitol 70 % solution 30 mL  30 mL Oral PRN Karmen Bongo, MD      . vancomycin (VANCOCIN) 2,000 mg in sodium chloride 0.9 % 500 mL IVPB  2,000 mg Intravenous Once Harvel Quale, RPH 250 mL/hr at 05/23/18 1345 2,000 mg at 05/23/18 1345  . [START ON 05/25/2018] vancomycin (VANCOCIN) IVPB 1000 mg/200 mL premix  1,000 mg Intravenous Q M,W,F-HD Masters, Jake Church, RPH      . zolpidem (AMBIEN) tablet 5 mg  5 mg Oral QHS PRN Karmen Bongo, MD       Labs: Basic Metabolic Panel: Recent Labs  Lab 05/23/18 0128  NA 129*  K 4.1  CL 89*  CO2 23  GLUCOSE 102*  BUN 67*  CREATININE 12.36*  CALCIUM 7.8*   Liver Function Tests: Recent Labs  Lab 05/23/18 0128  AST 37  ALT 20  ALKPHOS 67  BILITOT 0.7  PROT 6.2*  ALBUMIN 3.3*   Recent Labs  Lab 05/23/18 0128  LIPASE 118*   No results for input(s): AMMONIA in the last 168 hours. CBC: Recent Labs  Lab 05/23/18 0128  WBC 2.7*  HGB 8.8*  HCT 27.5*  MCV 88.7  PLT 245    Studies/Results: Ct Abdomen Pelvis Wo Contrast  Result Date: 05/23/2018 CLINICAL DATA:  Bilateral eye pain, fever, chills, diarrhea, and left sided abdominal pain following dialysis 3 days ago. Hx renal  disorder, HTN, gout, and abdominal hysterectomy. EXAM: CT ABDOMEN AND PELVIS WITHOUT CONTRAST TECHNIQUE: Multidetector CT imaging of the abdomen and pelvis was performed following the standard protocol without IV contrast. COMPARISON:  05/25/2016 FINDINGS: Lower chest: Lung bases are clear. Hepatobiliary: No focal liver abnormality is seen. Status post cholecystectomy. No biliary dilatation. Pancreas: Unremarkable. No pancreatic ductal dilatation or surrounding inflammatory changes. Spleen: Normal in size without focal abnormality. Adrenals/Urinary Tract: No adrenal gland nodules. Cyst on the left kidney without change. No hydronephrosis or hydroureter. No urinary tract stones. Bladder is unremarkable. Stomach/Bowel: Stomach is within normal limits. Appendix appears normal. No evidence of bowel wall thickening, distention, or inflammatory changes. Vascular/Lymphatic: No significant vascular findings are present. No enlarged abdominal or pelvic lymph nodes. Reproductive: Status post hysterectomy. No adnexal masses. Other: Small right inguinal hernia containing fat. No free air or free fluid in the abdomen. Musculoskeletal: No acute or significant osseous findings. IMPRESSION: No acute process demonstrated in the abdomen or pelvis. Electronically Signed   By: Lucienne Capers M.D.   On: 05/23/2018 06:00   Dg Chest 2 View  Result Date: 05/23/2018 CLINICAL DATA:  Fever and chills today. EXAM: CHEST - 2 VIEW COMPARISON:  04/19/2018 FINDINGS: Interval placement of a right central venous catheter with tip over the cavoatrial junction region. No pneumothorax. Heart size and pulmonary vascularity are normal. Lungs are clear. No blunting of costophrenic angles. No pneumothorax. Mediastinal contours appear intact.  Surgical clips in the right upper quadrant. IMPRESSION: No evidence of active pulmonary disease. Electronically Signed   By: Lucienne Capers M.D.   On: 05/23/2018 05:16    ROS: As per HPI otherwise negative.  Physical Exam: Vitals:   05/23/18 0700 05/23/18 0800 05/23/18 0900 05/23/18 1030  BP: (!) 140/93 (!) 151/98 (!) 150/94 (!) 146/86  Pulse: 78 79 79   Resp: (!) 27 (!) 26 12   Temp: 98.8 F (37.1 C)   98.4 F (36.9 C)  TempSrc: Oral   Oral  SpO2: 97% 97% 100% 99%     General: WDWN ill appearing AAF Head: NCAT sclera not icteric MMM Neck: Supple.  Lungs: CTA bilaterally without wheezes, rales, or rhonchi. Breathing is unlabored. Heart: RRR with S1 S2. No rub Abdomen: soft NT + BS Lower extremities: tr LE  edema or ischemic changes, no open wounds  Neuro: A & O  X 3. Moves all extremities spontaneously. Psych:  Responds to questions appropriately with a normal affect. Dialysis Access: right IJ TDC, no sig drainage on dressing, no erythema, nontender tunnel, skin dry at exit  Dialysis Orders: [facility] M[facility] 4 hr 2 K 2.5 Ca bath 400/600 right IJ TDC heparin 2000 venofer 100 through 11/11 - not on ESA yet or VDRA Recent labs: hgb 8.2 10/30 11% sat 10/9 K 4.1 iPTH 1367 alb 3.8 P 6.2 Ca 8.4 prior WBC 10/23 5.7  Exit site culture 10/21 - no growth - empirically treated with 3 doses of Vanc and Fortaz for serosanguinous drainage from cath exit  Assessment/Plan: 1. Acute febrile illness  -urine and BC drawn, cxr neg, WBC low 2.7 ,lipase elevated  influenza negative, resp panel pending/ neg abd and pelvis CT- on Vanc/Maxipime and flagyl 2. ESRD -  MWF - HD today K 4.1 - use 4 K bath - only access is [facility] - she is interested in pursuing PD which was discussed at CP mtg last week - at that time discussed the pros and cons of having an AVF when you do PD. 3.  Hypertension/volume  - titrate volume for BP control CXR neg - labetolol 300 bid also has amlodpine ordered on home med list at outpt HD and  lasix 40 qod - will see how she does with HD today - and decide on resuming the amlodipine  4. Anemia  - Has received most of full course of IV Fe - hold for now given fever - start ESA -Aranesp at 60 5. Metabolic bone disease -  Elevated iPTH 1367  - start calcitriol at 0.5 - had been on 2.5 bath -likely will need to lower to 2.25 or 2 at d/c- has not officially been started on any binders yet but outpt P was 6.2 - will need to address when eating and feeling better and also see what her insurance will cover 6.  Nutrition - renal diet/vitamin  Myriam Jacobson, PA-C [facility] [phone] 05/23/2018, 2:11 PM

## 2018-05-23 NOTE — Progress Notes (Signed)
Pharmacy Antibiotic Note  Angela Kerr is a 57 y.o. female admitted on 05/23/2018 with fever, abdominal pain and diarrhea. Pt is ESRD on HD. Planning to start empiric broad spectrum antibiotics.   Plan: -Vancomycin 2 g IV x1 then 1 g IV qHD-MWF -Cefepime 2 g IV x1 then 1 g IV q24h -Monitor HD plans, cultures, VR as needed     Temp (24hrs), Avg:99.5 F (37.5 C), Min:98.8 F (37.1 C), Max:100.5 F (38.1 C)  Recent Labs  Lab 05/23/18 0128 05/23/18 0510  WBC 2.7*  --   CREATININE 12.36*  --   LATICACIDVEN  --  0.55    Antimicrobials this admission: 11/4 cefepime > 11/4 vancomycin >  Dose adjustments this admission: N/A  Microbiology results: 11/4 blood cx: 11/4 urine cx: 11/4 RVP:  Harvel Quale 05/23/2018 9:43 AM

## 2018-05-23 NOTE — ED Provider Notes (Signed)
TIME SEEN: 4:42 AM  CHIEF COMPLAINT: Fever, abdominal pain, diarrhea  HPI: Patient is a 57 year old female with history of hypertension, end-stage renal disease on hemodialysis who was last dialyzed Friday, November 1 who presents to the emergency department with fevers, chills, feeling poorly since Friday.  States she started having some left upper abdominal pain and diarrhea today.  Has had some nausea but no vomiting.  No cough.  Has had a flu shot this year.  Dialysis catheter in the right chest.  Having pain behind her eyes but no neck pain or neck stiffness.   ROS: See HPI Constitutional:  fever  Eyes: no drainage  ENT: no runny nose   Cardiovascular:  no chest pain  Resp: no SOB  GI: no vomiting GU: no dysuria Integumentary: no rash  Allergy: no hives  Musculoskeletal: no leg swelling  Neurological: no slurred speech ROS otherwise negative  PAST MEDICAL HISTORY/PAST SURGICAL HISTORY:  Past Medical History:  Diagnosis Date  . Gout   . Hemodialysis patient ([place])   . Hypertension   . Renal disorder     MEDICATIONS:  Prior to Admission medications   Medication Sig Start Date End Date Taking? Authorizing Provider  allopurinol (ZYLOPRIM) 100 MG tablet Take 100 mg by mouth daily as needed (for gout flares).  05/19/16 05/23/26 Yes [provider]  furosemide (LASIX) 20 MG tablet Take 20 mg by mouth daily.   Yes [provider]  labetalol (NORMODYNE) 300 MG tablet Take 300 mg by mouth 2 (two) times daily. 05/18/16 05/23/26 Yes [provider]  albuterol (PROVENTIL HFA;VENTOLIN HFA) 108 (90 BASE) MCG/ACT inhaler Inhale 1-2 puffs into the lungs every 6 (six) hours as needed for wheezing. Patient not taking: Reported on 05/25/2016 11/09/12   Moreno-Coll, Adlih, MD  predniSONE (STERAPRED UNI-PAK 21 TAB) 10 MG (21) TBPK tablet Take by mouth daily. Take 6 tabs by mouth daily  for 2 days, then 5 tabs for 2 days, then 4 tabs for 2 days, then 3 tabs for 2 days, 2  tabs for 2 days, then 1 tab by mouth daily for 2 days Patient not taking: Reported on 05/23/2018 02/26/18   Jacqualine Mau, NP    ALLERGIES:  Allergies  Allergen Reactions  . Codeine Nausea And Vomiting  . Other Other (See Comments)    Certain B/P meds cause leg pain    SOCIAL HISTORY:  Social History   Tobacco Use  . Smoking status: Never Smoker  . Smokeless tobacco: Never Used  Substance Use Topics  . Alcohol use: No    FAMILY HISTORY: Family History  Problem Relation Age of Onset  . Alcoholism Mother     EXAM: BP (!) 175/110 (BP Location: Right Arm)   Pulse (!) 102   Temp 99.1 F (37.3 C) (Oral)   Resp 18   SpO2 100%  CONSTITUTIONAL: Alert and oriented and responds appropriately to questions.  Febrile.  Appears uncomfortable but is nontoxic appearing. HEAD: Normocephalic EYES: Conjunctivae clear, pupils appear equal, EOMI ENT: normal nose; moist mucous membranes NECK: Supple, no meningismus, no nuchal rigidity, no LAD  CARD: Regular and tachycardic; S1 and S2 appreciated; no murmurs, no clicks, no rubs, no gallops; dialysis catheter in the right chest with no tenderness, redness or warmth RESP: Normal chest excursion without splinting or tachypnea; breath sounds clear and equal bilaterally; no wheezes, no rhonchi, no rales, no hypoxia or respiratory distress, speaking full sentences ABD/GI: Normal bowel sounds; non-distended; soft, tender in the left upper  quadrant, no rebound, no guarding, no peritoneal signs, no hepatosplenomegaly BACK:  The back appears normal and is non-tender to palpation, there is no CVA tenderness EXT: Normal ROM in all joints; non-tender to palpation; no edema; normal capillary refill; no cyanosis, no calf tenderness or swelling    SKIN: Normal color for age and race; warm; no rash NEURO: Moves all extremities equally PSYCH: The patient's mood and manner are appropriate. Grooming and personal hygiene are appropriate.  MEDICAL DECISION  MAKING: Patient here with fever.  Labs obtained in triage show leukopenia which is new for patient.  LFTs are normal.  She does have an elevated lipase.  Differential diagnosis includes bacteremia, colitis, pancreatitis, pneumonia, UTI.  Will obtain chest x-ray to rule out a left lower lobe pneumonia, urine, blood cultures.  She does have headache but no meningismus.  Will give fentanyl, Zofran for symptomatic relief.  Anticipate admission.  ED PROGRESS: Patient's chest x-ray is clear.  Urine shows no sign of infection.  CT of abdomen pelvis normal.  She meets sirs criteria given febrile with leukopenia.  Will admit given she is high risk for bacteremia.  Flu swab negative.   6:30 AM Discussed patient's case with hospitalist, Dr. Alcario Drought.  I have recommended admission and patient (and family if present) agree with this plan. Admitting physician will place admission orders.   I reviewed all nursing notes, vitals, pertinent previous records, EKGs, lab and urine results, imaging (as available).      EKG Interpretation  Date/Time:  Monday May 23 2018 06:33:33 EST Ventricular Rate:  81 PR Interval:    QRS Duration: 97 QT Interval:  399 QTC Calculation: 464 R Axis:   -60 Text Interpretation:  Sinus rhythm Probable left atrial enlargement Left anterior fascicular block Left ventricular hypertrophy Anterior infarct, old No significant change since last tracing Confirmed by Zyona Pettaway, Cyril Mourning [phone]) on 05/23/2018 6:36:20 AM        CRITICAL CARE Performed by: Cyril Mourning Jaxon Flatt   Total critical care time: 45 minutes  Critical care time was exclusive of separately billable procedures and treating other patients.  Critical care was necessary to treat or prevent imminent or life-threatening deterioration.  Critical care was time spent personally by me on the following activities: development of treatment plan with patient and/or surrogate as well as nursing, discussions with consultants, evaluation  of patient's response to treatment, examination of patient, obtaining history from patient or surrogate, ordering and performing treatments and interventions, ordering and review of laboratory studies, ordering and review of radiographic studies, pulse oximetry and re-evaluation of patient's condition.      [name], Delice Bison, DO 05/23/18 [phone]

## 2018-05-23 NOTE — Progress Notes (Signed)
Pt arrived on the unit accompanied by two RNs from [place]; pt on 6L [place] and very tachypneic; Pt started desatting to the 80's then 70's; Aline Brochure, Rapid Response and Kayla, Respiratory called to the unit; pt placed on a Bi-Pap and was assessed by Aline Brochure, Rapid Response. Both were on the unit for at least 5mins; Pt sats 100% on Bi-Pap; pt states she is  7  1-10; Dr. Donato Heinz called and made aware. Rapid Response will check back on the pt in 51mins.

## 2018-05-23 NOTE — Progress Notes (Signed)
Patient reported feeling suddenly short of breath at 1630. Patient O2 sats 84% and patient was placed on [place] at 2L and quickly titrated up to 6L to achieve sats of 95%. RT called to give PRN neb treatment. Dr. Lorin Mercy paged regarding patient sats, oxygen requirement, coarse crackles and shortness of breath @1633 . MD order to get the patient to dialysis. Patient transported to dialysis by charge nurse and another nurse at conclusion of neb treatment at approximately 1640 .

## 2018-05-23 NOTE — ED Triage Notes (Signed)
While at dialysis on Friday pt started feeling cold.  Went home and layed down and starting having bilateral eye pain, fever, chills, diarrhea, and L sided abd pain.

## 2018-05-23 NOTE — H&P (Signed)
History and Physical    Angela Kerr UUV:[id number] DOB: March 20, 1961 DOA: 05/23/2018  PCP: Nena Polio, NP Consultants:  Nephrology Patient coming from:  Home - lives alone; NOK: Daughter, [phone]  Chief Complaint: Fever  HPI: Angela Kerr is a 57 y.o. female with medical history significant of ESRD on MWF HD, HTN presenting with fever, abdominal pain, diarrhea.  Friday, she went to HD and felt very chilled the entire time and her eyes were hurting (that started Thursday).  She came home and went to bed and has been in bed since.  She has had fever, chills, diarrhea, eye pain, pain in her LLQ, sore throat.  She has been too weak to do anything.   Fever to 101 here, but subjective at home.  Slight cough, productive of thick greenish phlegm.  Diarrhea started today before she came to the ER - 2 loose stools at home and none since.  LLQ pain started Friday, comes and goes.  No urinary symptoms.   ED Course:  Carryover, per Dr. Alcario Drought: 57 yo F with ESRD, meets SIRS criteria. No obvious source. Had abd pain but CT neg. Still makes urine but UA neg. BCx pending. EDP requesting admit pending BCx results since shes high risk.  Review of Systems: As per HPI; otherwise review of systems reviewed and negative.   Ambulatory Status:  Ambulates without assistance  Past Medical History:  Diagnosis Date  . ESRD (end stage renal disease) on dialysis ([place])   . Gout   . Hypertension   . Vision loss of right eye    within the last 6 months, seeing retina specialist    Past Surgical History:  Procedure Laterality Date  . ABDOMINAL HYSTERECTOMY    . CHOLECYSTECTOMY      Social History   Socioeconomic History  . Marital status: Divorced    Spouse name: Not on file  . Number of children: Not on file  . Years of education: Not on file  . Highest education level: Not on file  Occupational History  . Occupation: disabled  Social Needs  . Financial resource  strain: Not on file  . Food insecurity:    Worry: Not on file    Inability: Not on file  . Transportation needs:    Medical: Not on file    Non-medical: Not on file  Tobacco Use  . Smoking status: Never Smoker  . Smokeless tobacco: Never Used  Substance and Sexual Activity  . Alcohol use: No  . Drug use: No  . Sexual activity: Not on file  Lifestyle  . Physical activity:    Days per week: Not on file    Minutes per session: Not on file  . Stress: Not on file  Relationships  . Social connections:    Talks on phone: Not on file    Gets together: Not on file    Attends religious service: Not on file    Active member of club or organization: Not on file    Attends meetings of clubs or organizations: Not on file    Relationship status: Not on file  . Intimate partner violence:    Fear of current or ex partner: Not on file    Emotionally abused: Not on file    Physically abused: Not on file    Forced sexual activity: Not on file  Other Topics Concern  . Not on file  Social History Narrative  . Not on file    Allergies  Allergen  Reactions  . Codeine Nausea And Vomiting  . Other Other (See Comments)    Certain B/P meds cause leg pain    Family History  Problem Relation Age of Onset  . Alcoholism Mother     Prior to Admission medications   Medication Sig Start Date End Date Taking? Authorizing Provider  allopurinol (ZYLOPRIM) 100 MG tablet Take 100 mg by mouth daily as needed (for gout flares).  05/19/16 05/23/26 Yes [provider]  furosemide (LASIX) 20 MG tablet Take 20 mg by mouth daily.   Yes [provider]  labetalol (NORMODYNE) 300 MG tablet Take 300 mg by mouth 2 (two) times daily. 05/18/16 05/23/26 Yes [provider]  albuterol (PROVENTIL HFA;VENTOLIN HFA) 108 (90 BASE) MCG/ACT inhaler Inhale 1-2 puffs into the lungs every 6 (six) hours as needed for wheezing. Patient not taking: Reported on 05/25/2016 11/09/12   Moreno-Coll, Adlih, MD    predniSONE (STERAPRED UNI-PAK 21 TAB) 10 MG (21) TBPK tablet Take by mouth daily. Take 6 tabs by mouth daily  for 2 days, then 5 tabs for 2 days, then 4 tabs for 2 days, then 3 tabs for 2 days, 2 tabs for 2 days, then 1 tab by mouth daily for 2 days Patient not taking: Reported on 05/23/2018 02/26/18   Jacqualine Mau, NP    Physical Exam: Vitals:   05/23/18 0700 05/23/18 0800 05/23/18 0900 05/23/18 1030  BP: (!) 140/93 (!) 151/98 (!) 150/94 (!) 146/86  Pulse: 78 79 79   Resp: (!) 27 (!) 26 12   Temp: 98.8 F (37.1 C)   98.4 F (36.9 C)  TempSrc: Oral   Oral  SpO2: 97% 97% 100% 99%     General:  Appears calm and comfortable and is NAD; she is quite soft-spoken Eyes:  PERRL, EOMI, normal lids, iris ENT:  grossly normal hearing, lips & tongue, mmm; appropriate dentition Neck:  no LAD, masses or thyromegaly; no carotid bruits Cardiovascular:  RRR, no m/r/g. No LE edema.  Respiratory:   CTA bilaterally with no wheezes/rales/rhonchi.  Normal respiratory effort. Abdomen:  soft, NT, ND, NABS Back:   normal alignment, left flank TTP Skin:  no rash or induration seen on limited exam Musculoskeletal:  grossly normal tone BUE/BLE, good ROM, no bony abnormality Lower extremity:  No LE edema.  Limited foot exam with no ulcerations.  2+ distal pulses. Psychiatric:  grossly normal mood and affect, speech fluent and appropriate, AOx3 Neurologic:  CN 2-12 grossly intact, moves all extremities in coordinated fashion, sensation intact    Radiological Exams on Admission: Ct Abdomen Pelvis Wo Contrast  Result Date: 05/23/2018 CLINICAL DATA:  Bilateral eye pain, fever, chills, diarrhea, and left sided abdominal pain following dialysis 3 days ago. Hx renal disorder, HTN, gout, and abdominal hysterectomy. EXAM: CT ABDOMEN AND PELVIS WITHOUT CONTRAST TECHNIQUE: Multidetector CT imaging of the abdomen and pelvis was performed following the standard protocol without IV contrast. COMPARISON:   05/25/2016 FINDINGS: Lower chest: Lung bases are clear. Hepatobiliary: No focal liver abnormality is seen. Status post cholecystectomy. No biliary dilatation. Pancreas: Unremarkable. No pancreatic ductal dilatation or surrounding inflammatory changes. Spleen: Normal in size without focal abnormality. Adrenals/Urinary Tract: No adrenal gland nodules. Cyst on the left kidney without change. No hydronephrosis or hydroureter. No urinary tract stones. Bladder is unremarkable. Stomach/Bowel: Stomach is within normal limits. Appendix appears normal. No evidence of bowel wall thickening, distention, or inflammatory changes. Vascular/Lymphatic: No significant vascular findings are present. No enlarged abdominal or pelvic lymph  nodes. Reproductive: Status post hysterectomy. No adnexal masses. Other: Small right inguinal hernia containing fat. No free air or free fluid in the abdomen. Musculoskeletal: No acute or significant osseous findings. IMPRESSION: No acute process demonstrated in the abdomen or pelvis. Electronically Signed   By: Lucienne Capers M.D.   On: 05/23/2018 06:00   Dg Chest 2 View  Result Date: 05/23/2018 CLINICAL DATA:  Fever and chills today. EXAM: CHEST - 2 VIEW COMPARISON:  04/19/2018 FINDINGS: Interval placement of a right central venous catheter with tip over the cavoatrial junction region. No pneumothorax. Heart size and pulmonary vascularity are normal. Lungs are clear. No blunting of costophrenic angles. No pneumothorax. Mediastinal contours appear intact. Surgical clips in the right upper quadrant. IMPRESSION: No evidence of active pulmonary disease. Electronically Signed   By: Lucienne Capers M.D.   On: 05/23/2018 05:16    EKG: Independently reviewed.  NSR with rate 81; LAFB, LVH; nonspecific ST changes with no evidence of acute ischemia   Labs on Admission: I have personally reviewed the available labs and imaging studies at the time of the admission.  Pertinent labs:   Na++  129 Glucose 102 BUN 67/Creatinine 12.36/GFR 3 Anion gap 17 Albumin 3.3 Calcium 7.8 Lipase 118 WBC 2.7 Hgb 8.8 - baseline about 9 Lactate 0.55 Flu negative UA:  Small hgb, >300 protein, few bacteria Blood culture pending  Assessment/Plan Principal Problem:   Sepsis, unspecified organism (HCC) Active Problems:   ESRD (end stage renal disease) (HCC)   Essential hypertension   Anemia due to chronic kidney disease   Sepsis -SIRS criteria in this patient includes: Leukopenia, fever, tachycardia, tachypnea  -Patient has no current evidence of acute organ failure  -While awaiting blood cultures, this may be a preseptic condition. -Sepsis protocol initiated -Uncertain source - UA is reassuring; negative CXR, unremarkable abdominal CT.  Will order viral panel and monitor for further declaration of source. -Blood and urine cultures pending -Will admit with telemetry and continue to monitor -Treat with IV Cefepime/Vanc/Flagyl for undifferentiated sepsis -Will add HIV -Flu negative -Will order lower respiratory tract procalcitonin level.  Antibiotics would not be indicated for PCT <0.1 and probably should not be used for < 0.25.  >0.5 indicates infection and >>0.5 indicates more serious disease.  As the procalcitonin level normalizes, it will be reasonable to consider de-escalation of antibiotic coverage.  ESRD on MWF HD -Patient on chronic MWF HD -Nephrology prn order set utilized -She does not appear to be volume overloaded or otherwise in need of acute HD -Dr. Johnney Ou notified of admission and need for routine HD  HTN -Continue Labetalol   DVT prophylaxis: Lovenox  Code Status:  Full - confirmed with patient Family Communication: None present Disposition Plan:  Home once clinically improved Consults called: Nephrology  Admission status: Admit - It is my clinical opinion that admission to INPATIENT is reasonable and necessary because of the expectation that this patient will  require hospital care that crosses at least 2 midnights to treat this condition based on the medical complexity of the problems presented.  Given the aforementioned information, the predictability of an adverse outcome is felt to be significant.    Karmen Bongo MD [facility]  If note is complete, please contact covering daytime or nighttime physician. [email or web address] Password [facility]  05/23/2018, 2:38 PM

## 2018-05-23 NOTE — Significant Event (Signed)
Rapid Response Event Note  Overview:  Called for acute resp distress Time Called: 1658 Arrival Time: 1702 Event Type: Respiratory  Initial Focused Assessment:  Lying in bed in HD bay - alert - warm and dry - able to speak but diffuculty with full sentences - bil BS coarse in all lobes bilaterally - resps 52 and labored - using accessory muscles.  Oriented and co-op.  Denies CP just SOB.  St 128  Bp 215/105.     Interventions: RT at bedside - placed on Bipap 12/6 50%.  Dr. Lorin Mercy notified.  Patient relaxing HR down 103 - remains with RR 40's but states she feels some better.  BP 170/87 HR 107 RR 38.  Pulling fluid with HD.  After 30 minutes of Bipap - remains with RR 40's and using accessory muscles.  States she feels some better but still very short of breath.  Can speak full sentence.  Bil BS less coarse now with some fine crackles - still working hard but states she does not feel tired.  Dr. Lorin Mercy updated.  Resting better - RR high 30's - HR 102 - more comfortable.  Spoke with Lyndee Leo RN on 2W who states the SOB happened suddenly shortly before transporting to HD - sudden onset of SOB with wet sounding BS.  Updated Lyndee Leo on current status - MD staff to evaluate after HD treatment for level of care.  Will follow.  Handoff to [employer].    Plan of Care (if not transferred):  Event Summary: Name of Physician Notified: Dr. Lorin Mercy at 1708  Name of Consulting Physician Notified: Dr. Servando Salina at (by Almyra Brace HD RN)        Quin Hoop

## 2018-05-24 DIAGNOSIS — R509 Fever, unspecified: Secondary | ICD-10-CM

## 2018-05-24 DIAGNOSIS — R6510 Systemic inflammatory response syndrome (SIRS) of non-infectious origin without acute organ dysfunction: Secondary | ICD-10-CM

## 2018-05-24 LAB — BASIC METABOLIC PANEL
Anion gap: 14 (ref 5–15)
BUN: 29 mg/dL — ABNORMAL HIGH (ref 6–20)
CO2: 24 mmol/L (ref 22–32)
Calcium: 8.3 mg/dL — ABNORMAL LOW (ref 8.9–10.3)
Chloride: 99 mmol/L (ref 98–111)
Creatinine, Ser: 6.97 mg/dL — ABNORMAL HIGH (ref 0.44–1.00)
GFR calc Af Amer: 7 mL/min — ABNORMAL LOW (ref >60)
GFR calc non Af Amer: 6 mL/min — ABNORMAL LOW (ref >60)
Glucose, Bld: 100 mg/dL — ABNORMAL HIGH (ref 70–99)
Potassium: 4.3 mmol/L (ref 3.5–5.1)
Sodium: 137 mmol/L (ref 135–145)

## 2018-05-24 LAB — MRSA PCR SCREENING: MRSA BY PCR: NEGATIVE

## 2018-05-24 LAB — URINE CULTURE: Culture: NO GROWTH

## 2018-05-24 LAB — HIV ANTIBODY (ROUTINE TESTING W REFLEX): HIV Screen 4th Generation wRfx: NONREACTIVE

## 2018-05-24 MED ORDER — CHLORHEXIDINE GLUCONATE CLOTH 2 % EX PADS
6.0000 | MEDICATED_PAD | Freq: Every day | CUTANEOUS | Status: DC
  Administered 2018-05-24 – 2018-05-26 (×3): 6 via TOPICAL

## 2018-05-24 MED ORDER — HEPARIN SODIUM (PORCINE) 5000 UNIT/ML IJ SOLN
5000.0000 [IU] | Freq: Three times a day (TID) | INTRAMUSCULAR | Status: DC
  Administered 2018-05-24 – 2018-05-27 (×9): 5000 [IU] via SUBCUTANEOUS
  Filled 2018-05-24 (×9): qty 1

## 2018-05-24 MED ORDER — FUROSEMIDE 10 MG/ML IJ SOLN
160.0000 mg | Freq: Once | INTRAVENOUS | Status: AC
  Administered 2018-05-24: 160 mg via INTRAVENOUS
  Filled 2018-05-24: qty 16

## 2018-05-24 NOTE — Plan of Care (Signed)
Discussed with patient plan of care for the evening, pain management and wearing bipap throughout the night

## 2018-05-24 NOTE — Progress Notes (Signed)
PROGRESS NOTE  Angela Kerr DZH:[id number] DOB: 01/13/1961 DOA: 05/23/2018 PCP: Nena Polio, NP   LOS: 1 day   Brief Narrative / Interim history: 57 year old female with history of end-stage renal disease on MWF HD for the past month on dialysis only, hypertension, who presents to the hospital with fever, abdominal pain, diarrhea as well as flulike symptoms for the last few days.  Also dialysis on Friday she was complaining of chills and muscle aches as well as her eyes were hurting and a mild HA.  She was also having a sore throat and generalized weakness.  She was found to be febrile here, septic appearing, will start on broad-spectrum antibiotics and was admitted to the hospital.  Nephrology was consulted.  He also been complaining of a cough with thick greenish phlegm.  Subjective: Continues to complain of a cough with green sputum production, denies any abdominal pain currently, no diarrhea.  Muscle aches are improving.  Assessment & Plan: Principal Problem:   Sepsis, unspecified organism ([place]) Active Problems:   ESRD (end stage renal disease) ([place])   Essential hypertension   Anemia due to chronic kidney disease   SIRS -Unclear source, potentially sepsis from a viral etiology given generalized symptoms, GI involvement, muscle weakness and URI type symptoms -She was started on broad-spectrum antibiotics with cefepime, metronidazole and vancomycin, continue while cultures are pending -Urinalysis unremarkable, chest x-ray without acute findings -Respiratory virus panel and influenza are negative.  Acute hypoxic respiratory failure -Felt to have fluid overload requiring BiPAP and was taken emergently to HD last night, discussed with nephrology Dr. Johnney Ou, will not dialyze but do IV Lasix today since he is making urine to continue fluid removal  End-stage renal disease on MWF HD -Nephrology consulted, appreciate input.  She was just started on dialysis about 4 weeks ago.   She has a temporary cath on the right side of the chest which does not look infected. -It appears that plans are in place for patient to pursue peritoneal dialysis  Hypertension -Continue labetalol    Scheduled Meds: . calcitRIOL  0.5 mcg Oral Q M,W,F-HD  . Chlorhexidine Gluconate Cloth  6 each Topical Q0600  . Chlorhexidine Gluconate Cloth  6 each Topical Q0600  . darbepoetin (ARANESP) injection - DIALYSIS  60 mcg Intravenous Q Mon-HD  . heparin injection (subcutaneous)  5,000 Units Subcutaneous Q8H  . labetalol  300 mg Oral BID  . multivitamin  1 tablet Oral QHS  . sodium chloride flush  3 mL Intravenous Q12H   Continuous Infusions: . sodium chloride    . sodium chloride    . ceFEPime (MAXIPIME) IV    . metronidazole 500 mg (05/24/18 1012)  . [START ON 05/25/2018] vancomycin     PRN Meds:.sodium chloride, sodium chloride, acetaminophen **OR** acetaminophen, albuterol, alteplase, calcium carbonate (dosed in mg elemental calcium), camphor-menthol **AND** hydrOXYzine, docusate sodium, feeding supplement (NEPRO CARB STEADY), fentaNYL (SUBLIMAZE) injection, heparin, heparin, hydrALAZINE, lidocaine (PF), lidocaine-prilocaine, ondansetron **OR** ondansetron (ZOFRAN) IV, pentafluoroprop-tetrafluoroeth, sorbitol, zolpidem  DVT prophylaxis: lovenox Code Status: Full code Family Communication: No family at bedside Disposition Plan: Home when ready  Consultants:   Nephrology  Procedures:   HD  Antimicrobials:  Vancomycin, cefepime, metronidazole 11/4 >>  Objective: Vitals:   05/24/18 0749 05/24/18 0902 05/24/18 1000 05/24/18 1400  BP:  (!) 147/89 (!) 156/95 103/68  Pulse: 97 (!) 102 100 91  Resp: 20 20 (!) 30 (!) 30  Temp:      TempSrc:      SpO2:  100% 99% 100% 99%  Weight:        Intake/Output Summary (Last 24 hours) at 05/24/2018 1508 Last data filed at 05/24/2018 0500 Gross per 24 hour  Intake 230 ml  Output -  Net 230 ml   Filed Weights   05/23/18 1730    Weight: 86.5 kg    Examination:  Constitutional: Appears tachypneic Eyes: PERRL, lids and conjunctivae normal ENMT: Mucous membranes are moist. No oropharyngeal exudates Neck: normal, supple Respiratory: clear to auscultation bilaterally, no wheezing, no crackles. Normal respiratory effort. No accessory muscle use.  Cardiovascular: Regular rate and rhythm, no murmurs / rubs / gallops. No LE edema. 2+ pedal pulses. No carotid bruits.  Abdomen: no tenderness. Bowel sounds positive.  Musculoskeletal: no clubbing / cyanosis.  HD cath without erythema or tenderness Skin: no rashes Neurologic: CN 2-12 grossly intact. Strength 5/5 in all 4.  Psychiatric: Normal judgment and insight. Alert and oriented x 3. Normal mood.    Data Reviewed: I have independently reviewed following labs and imaging studies   CBC: Recent Labs  Lab 05/23/18 0128  WBC 2.7*  HGB 8.8*  HCT 27.5*  MCV 88.7  PLT 706   Basic Metabolic Panel: Recent Labs  Lab 05/23/18 0128 05/24/18 0202  NA 129* 137  K 4.1 4.3  CL 89* 99  CO2 23 24  GLUCOSE 102* 100*  BUN 67* 29*  CREATININE 12.36* 6.97*  CALCIUM 7.8* 8.3*   GFR: CrCl cannot be calculated (Unknown ideal weight.). Liver Function Tests: Recent Labs  Lab 05/23/18 0128  AST 37  ALT 20  ALKPHOS 67  BILITOT 0.7  PROT 6.2*  ALBUMIN 3.3*   Recent Labs  Lab 05/23/18 0128  LIPASE 118*   No results for input(s): AMMONIA in the last 168 hours. Coagulation Profile: No results for input(s): INR, PROTIME in the last 168 hours. Cardiac Enzymes: No results for input(s): CKTOTAL, CKMB, CKMBINDEX, TROPONINI in the last 168 hours. BNP (last 3 results) No results for input(s): PROBNP in the last 8760 hours. HbA1C: No results for input(s): HGBA1C in the last 72 hours. CBG: No results for input(s): GLUCAP in the last 168 hours. Lipid Profile: No results for input(s): CHOL, HDL, LDLCALC, TRIG, CHOLHDL, LDLDIRECT in the last 72 hours. Thyroid Function  Tests: No results for input(s): TSH, T4TOTAL, FREET4, T3FREE, THYROIDAB in the last 72 hours. Anemia Panel: No results for input(s): VITAMINB12, FOLATE, FERRITIN, TIBC, IRON, RETICCTPCT in the last 72 hours. Urine analysis:    Component Value Date/Time   COLORURINE YELLOW 05/23/2018 0510   APPEARANCEUR CLEAR 05/23/2018 0510   LABSPEC 1.008 05/23/2018 0510   PHURINE 8.0 05/23/2018 0510   GLUCOSEU NEGATIVE 05/23/2018 0510   HGBUR SMALL (A) 05/23/2018 0510   BILIRUBINUR NEGATIVE 05/23/2018 0510   KETONESUR NEGATIVE 05/23/2018 0510   PROTEINUR >=300 (A) 05/23/2018 0510   UROBILINOGEN 0.2 08/20/2017 1814   NITRITE NEGATIVE 05/23/2018 0510   LEUKOCYTESUR NEGATIVE 05/23/2018 0510   Sepsis Labs: Invalid input(s): PROCALCITONIN, LACTICIDVEN  Recent Results (from the past 240 hour(s))  Blood culture (routine x 2)     Status: None (Preliminary result)   Collection Time: 05/23/18  4:49 AM  Result Value Ref Range Status   Specimen Description BLOOD RIGHT HAND  Final   Special Requests   Final    BOTTLES DRAWN AEROBIC ONLY Blood Culture results may not be optimal due to an excessive volume of blood received in culture bottles   Culture   Final    NO  GROWTH 1 DAY Performed at [facility], [place] [address] [place], [place] [address]    Report Status PENDING  Incomplete  Blood culture (routine x 2)     Status: None (Preliminary result)   Collection Time: 05/23/18  4:50 AM  Result Value Ref Range Status   Specimen Description BLOOD RIGHT ARM  Final   Special Requests   Final    BOTTLES DRAWN AEROBIC AND ANAEROBIC Blood Culture results may not be optimal due to an excessive volume of blood received in culture bottles   Culture   Final    NO GROWTH 1 DAY Performed at [facility], [place] [address] [place], [place] [address]    Report Status PENDING  Incomplete  Culture, Urine     Status: None   Collection Time: 05/23/18  7:46 AM  Result Value Ref Range Status   Specimen  Description URINE, RANDOM  Final   Special Requests NONE  Final   Culture   Final    NO GROWTH Performed at [facility], [place] [address], [place], [place] [address]    Report Status 05/24/2018 FINAL  Final  Respiratory Panel by PCR     Status: None   Collection Time: 05/23/18  9:03 AM  Result Value Ref Range Status   Adenovirus NOT DETECTED NOT DETECTED Final   Coronavirus 229E NOT DETECTED NOT DETECTED Final   Coronavirus HKU1 NOT DETECTED NOT DETECTED Final   Coronavirus NL63 NOT DETECTED NOT DETECTED Final   Coronavirus OC43 NOT DETECTED NOT DETECTED Final   Metapneumovirus NOT DETECTED NOT DETECTED Final   Rhinovirus / Enterovirus NOT DETECTED NOT DETECTED Final   Influenza A NOT DETECTED NOT DETECTED Final   Influenza B NOT DETECTED NOT DETECTED Final   Parainfluenza Virus 1 NOT DETECTED NOT DETECTED Final   Parainfluenza Virus 2 NOT DETECTED NOT DETECTED Final   Parainfluenza Virus 3 NOT DETECTED NOT DETECTED Final   Parainfluenza Virus 4 NOT DETECTED NOT DETECTED Final   Respiratory Syncytial Virus NOT DETECTED NOT DETECTED Final   Bordetella pertussis NOT DETECTED NOT DETECTED Final   Chlamydophila pneumoniae NOT DETECTED NOT DETECTED Final   Mycoplasma pneumoniae NOT DETECTED NOT DETECTED Final    Comment: Performed at [facility], [place] [address] [place], [place] [address]  MRSA PCR Screening     Status: None   Collection Time: 05/23/18 11:19 PM  Result Value Ref Range Status   MRSA by PCR NEGATIVE NEGATIVE Final    Comment:        The GeneXpert MRSA Assay (FDA approved for NASAL specimens only), is one component of a comprehensive MRSA colonization surveillance program. It is not intended to diagnose MRSA infection nor to guide or monitor treatment for MRSA infections. Performed at [facility], [place] [address] [place], [place] [address]       Radiology Studies: Ct Abdomen Pelvis Wo Contrast  Result Date: 05/23/2018 CLINICAL DATA:   Bilateral eye pain, fever, chills, diarrhea, and left sided abdominal pain following dialysis 3 days ago. Hx renal disorder, HTN, gout, and abdominal hysterectomy. EXAM: CT ABDOMEN AND PELVIS WITHOUT CONTRAST TECHNIQUE: Multidetector CT imaging of the abdomen and pelvis was performed following the standard protocol without IV contrast. COMPARISON:  05/25/2016 FINDINGS: Lower chest: Lung bases are clear. Hepatobiliary: No focal liver abnormality is seen. Status post cholecystectomy. No biliary dilatation. Pancreas: Unremarkable. No pancreatic ductal dilatation or surrounding inflammatory changes. Spleen: Normal in size without focal  abnormality. Adrenals/Urinary Tract: No adrenal gland nodules. Cyst on the left kidney without change. No hydronephrosis or hydroureter. No urinary tract stones. Bladder is unremarkable. Stomach/Bowel: Stomach is within normal limits. Appendix appears normal. No evidence of bowel wall thickening, distention, or inflammatory changes. Vascular/Lymphatic: No significant vascular findings are present. No enlarged abdominal or pelvic lymph nodes. Reproductive: Status post hysterectomy. No adnexal masses. Other: Small right inguinal hernia containing fat. No free air or free fluid in the abdomen. Musculoskeletal: No acute or significant osseous findings. IMPRESSION: No acute process demonstrated in the abdomen or pelvis. Electronically Signed   By: Lucienne Capers M.D.   On: 05/23/2018 06:00   Dg Chest 2 View  Result Date: 05/23/2018 CLINICAL DATA:  Fever and chills today. EXAM: CHEST - 2 VIEW COMPARISON:  04/19/2018 FINDINGS: Interval placement of a right central venous catheter with tip over the cavoatrial junction region. No pneumothorax. Heart size and pulmonary vascularity are normal. Lungs are clear. No blunting of costophrenic angles. No pneumothorax. Mediastinal contours appear intact. Surgical clips in the right upper quadrant. IMPRESSION: No evidence of active pulmonary disease.  Electronically Signed   By: Lucienne Capers M.D.   On: 05/23/2018 05:16     Marzetta Board, MD, PhD [facility] Pager [phone]  If 7PM-7AM, please contact night-coverage [email or web address] Password [email or web address] 05/24/2018, 3:08 PM

## 2018-05-24 NOTE — Care Management Note (Signed)
Case Management Note  Patient Details  Name: Angela Kerr MRN: [id number] Date of Birth: August 09, 1960  Subjective/Objective:   From home alone, son lives near by, no insurance or pcp,  follow up shceduled with primary care at [facility] 11/14 at 10:10 and she can use pharmacy at [facility] for discounted drugs.   She is a HD patient MWF, on lasix drip.  She applied for medicaid in September per patient. She states she has transportation at discharge.  NCM gave her brochures for [facility] clinic and the Primary Care at [facility].  If she is dc over the weekend she may need med ast with Match letter.                  Action/Plan: NCM will follow for transition of care needs.   Expected Discharge Date:                  Expected Discharge Plan:  Home/Self Care  In-House Referral:     Discharge planning Services  Follow-up appt scheduled, Medication Assistance, [facility]  Post Acute Care Choice:    Choice offered to:     DME Arranged:    DME Agency:     HH Arranged:    HH Agency:     Status of Service:  In process, will continue to follow  If discussed at Long Length of Stay Meetings, dates discussed:    Additional Comments:  Zenon Mayo, RN 05/24/2018, 3:43 PM

## 2018-05-24 NOTE — Progress Notes (Signed)
[facility] Progress Note   Subjective:   Acute dyspnea just prior to dialysis yesterday. Required bipap and wore it overnight too.   She feels that the issue was volume overload.   Taken off Bipap this AM and maintaining sats on [place] currently.  Myalgias improved and she globally feels improved.  Blood cx pending  Objective Vitals:   05/24/18 0600 05/24/18 0748 05/24/18 0749 05/24/18 0902  BP: 139/87 131/87  (!) 147/89  Pulse: 99 87 97 (!) 102  Resp:  17 20 20   Temp: 98.1 F (36.7 C) (!) 103 F (39.4 C)    TempSrc: Axillary Oral    SpO2: 98% 100% 100% 99%  Weight:       Physical Exam General: tired but nontoxic Heart: RRR Lungs: faint rales throughout, no rhonchi or wheezing; normal WOB Abdomen: soft, nontender Extremities: no edema Dialysis Access:  [facility] c/d/i  Additional Objective Labs: Basic Metabolic Panel: Recent Labs  Lab 05/23/18 0128 05/24/18 0202  NA 129* 137  K 4.1 4.3  CL 89* 99  CO2 23 24  GLUCOSE 102* 100*  BUN 67* 29*  CREATININE 12.36* 6.97*  CALCIUM 7.8* 8.3*   Liver Function Tests: Recent Labs  Lab 05/23/18 0128  AST 37  ALT 20  ALKPHOS 67  BILITOT 0.7  PROT 6.2*  ALBUMIN 3.3*   Recent Labs  Lab 05/23/18 0128  LIPASE 118*   CBC: Recent Labs  Lab 05/23/18 0128  WBC 2.7*  HGB 8.8*  HCT 27.5*  MCV 88.7  PLT 245   Blood Culture    Component Value Date/Time   SDES BLOOD RIGHT ARM 05/23/2018 0450   SPECREQUEST  05/23/2018 0450    BOTTLES DRAWN AEROBIC AND ANAEROBIC Blood Culture results may not be optimal due to an excessive volume of blood received in culture bottles   CULT  05/23/2018 0450    NO GROWTH < 12 HOURS Performed at [facility], [place] [address] [place],  [address]    REPTSTATUS PENDING 05/23/2018 0450    Cardiac Enzymes: No results for input(s): CKTOTAL, CKMB, CKMBINDEX, TROPONINI in the last 168 hours. CBG: No results for input(s): GLUCAP in the last 168 hours. Iron  Studies: No results for input(s): IRON, TIBC, TRANSFERRIN, FERRITIN in the last 72 hours. @lablastinr3 @ Studies/Results: Ct Abdomen Pelvis Wo Contrast  Result Date: 05/23/2018 CLINICAL DATA:  Bilateral eye pain, fever, chills, diarrhea, and left sided abdominal pain following dialysis 3 days ago. Hx renal disorder, HTN, gout, and abdominal hysterectomy. EXAM: CT ABDOMEN AND PELVIS WITHOUT CONTRAST TECHNIQUE: Multidetector CT imaging of the abdomen and pelvis was performed following the standard protocol without IV contrast. COMPARISON:  05/25/2016 FINDINGS: Lower chest: Lung bases are clear. Hepatobiliary: No focal liver abnormality is seen. Status post cholecystectomy. No biliary dilatation. Pancreas: Unremarkable. No pancreatic ductal dilatation or surrounding inflammatory changes. Spleen: Normal in size without focal abnormality. Adrenals/Urinary Tract: No adrenal gland nodules. Cyst on the left kidney without change. No hydronephrosis or hydroureter. No urinary tract stones. Bladder is unremarkable. Stomach/Bowel: Stomach is within normal limits. Appendix appears normal. No evidence of bowel wall thickening, distention, or inflammatory changes. Vascular/Lymphatic: No significant vascular findings are present. No enlarged abdominal or pelvic lymph nodes. Reproductive: Status post hysterectomy. No adnexal masses. Other: Small right inguinal hernia containing fat. No free air or free fluid in the abdomen. Musculoskeletal: No acute or significant osseous findings. IMPRESSION: No acute process demonstrated in the abdomen or pelvis. Electronically Signed   By:  Lucienne Capers M.D.   On: 05/23/2018 06:00   Dg Chest 2 View  Result Date: 05/23/2018 CLINICAL DATA:  Fever and chills today. EXAM: CHEST - 2 VIEW COMPARISON:  04/19/2018 FINDINGS: Interval placement of a right central venous catheter with tip over the cavoatrial junction region. No pneumothorax. Heart size and pulmonary vascularity are normal. Lungs  are clear. No blunting of costophrenic angles. No pneumothorax. Mediastinal contours appear intact. Surgical clips in the right upper quadrant. IMPRESSION: No evidence of active pulmonary disease. Electronically Signed   By: Lucienne Capers M.D.   On: 05/23/2018 05:16   Medications: . sodium chloride    . sodium chloride    . ceFEPime (MAXIPIME) IV    . furosemide    . metronidazole 500 mg (05/24/18 0211)  . [START ON 05/25/2018] vancomycin     . calcitRIOL  0.5 mcg Oral Q M,W,F-HD  . Chlorhexidine Gluconate Cloth  6 each Topical Q0600  . Chlorhexidine Gluconate Cloth  6 each Topical Q0600  . darbepoetin (ARANESP) injection - DIALYSIS  60 mcg Intravenous Q Mon-HD  . enoxaparin (LOVENOX) injection  30 mg Subcutaneous Q24H  . labetalol  300 mg Oral BID  . multivitamin  1 tablet Oral QHS  . sodium chloride flush  3 mL Intravenous Q12H   Dialysis Orders: [facility] MWF 4 hr 2 K 2.5 Ca bath 400/600 right IJ TDC heparin 2000 venofer 100 through 11/11 - not on ESA yet or VDRA Recent labs: hgb 8.2 10/30 11% sat 10/9 K 4.1 iPTH 1367 alb 3.8 P 6.2 Ca 8.4 prior WBC 10/23 5.7  Exit site culture 10/21 - no growth - empirically treated with 3 doses of Vanc and Fortaz for serosanguinous drainage from cath exit   Assessment/Plan: 1. Acute febrile illness  -urine and BC drawn, cxr neg, WBC low 2.7 ,lipase elevated  influenza negative, resp panel pending/ neg abd and pelvis CT- on Vanc/Maxipime and flagyl.  Cont to follow cultures (HD catheter not cultured to date, if peripheral cultures + will address).  Febrile again this AM.  2. ESRD -  MWF - HD yesterday with aggressive volume removal in setting of acute hypoxia and resp distress.  Improved this morning but lung exam still suggests acute process.  Will try diuresis with lasix 160 IV now.  Added on strict I/Os and daily standing weight.   3. Hypertension/volume  - Relatively normotensive since admission.  On labetolol 300 bid also has amlodpine ordered on home  med list at outpt HD.  Continue to hold amlodipine 10 in setting of acute illness and diuresis.   4. Anemia  - Has received most of full course of IV Fe - hold for now given fever - started ESA -Aranesp at 60 11/4 5. Metabolic bone disease -  Elevated iPTH 1367  - started calcitriol at 0.5 - had been on 2.5 bath -likely will need to lower to 2.25 or 2 at d/c- has not officially been started on any binders yet but outpt P was 6.2 - will need to address when eating and feeling better and also see what her insurance will cover 6.  Nutrition - renal diet/vitamin   Jannifer Hick MD 05/24/2018, 9:10 AM  [facility] Pager: [phone]

## 2018-05-24 NOTE — Progress Notes (Signed)
HD tx completed @ 2053 w/o problem UF goal met Blood rinsed back VSS Report called to Wynonia Hazard, RN

## 2018-05-25 LAB — CBC
HCT: 26.8 % — ABNORMAL LOW (ref 36.0–46.0)
HEMOGLOBIN: 8.6 g/dL — AB (ref 12.0–15.0)
MCH: 28.4 pg (ref 26.0–34.0)
MCHC: 32.1 g/dL (ref 30.0–36.0)
MCV: 88.4 fL (ref 80.0–100.0)
Platelets: 188 10*3/uL (ref 150–400)
RBC: 3.03 MIL/uL — AB (ref 3.87–5.11)
RDW: 12.6 % (ref 11.5–15.5)
WBC: 2.4 10*3/uL — AB (ref 4.0–10.5)
nRBC: 0 % (ref 0.0–0.2)

## 2018-05-25 LAB — BASIC METABOLIC PANEL
ANION GAP: 15 (ref 5–15)
BUN: 55 mg/dL — ABNORMAL HIGH (ref 6–20)
CO2: 22 mmol/L (ref 22–32)
Calcium: 8.2 mg/dL — ABNORMAL LOW (ref 8.9–10.3)
Chloride: 96 mmol/L — ABNORMAL LOW (ref 98–111)
Creatinine, Ser: 10.19 mg/dL — ABNORMAL HIGH (ref 0.44–1.00)
GFR calc Af Amer: 4 mL/min — ABNORMAL LOW (ref >60)
GFR, EST NON AFRICAN AMERICAN: 4 mL/min — AB (ref >60)
GLUCOSE: 103 mg/dL — AB (ref 70–99)
POTASSIUM: 4.3 mmol/L (ref 3.5–5.1)
Sodium: 133 mmol/L — ABNORMAL LOW (ref 135–145)

## 2018-05-25 MED ORDER — HEPARIN SODIUM (PORCINE) 1000 UNIT/ML IJ SOLN
INTRAMUSCULAR | Status: AC
  Filled 2018-05-25: qty 4

## 2018-05-25 MED ORDER — CALCITRIOL 0.5 MCG PO CAPS
ORAL_CAPSULE | ORAL | Status: AC
  Administered 2018-05-25: 0.5 ug via ORAL
  Filled 2018-05-25: qty 1

## 2018-05-25 MED ORDER — SODIUM CHLORIDE 0.9 % IV SOLN: 100.0000 mL | INTRAVENOUS | Status: DC | PRN

## 2018-05-25 MED ORDER — HEPARIN SODIUM (PORCINE) 1000 UNIT/ML DIALYSIS: 2000.0000 [IU] | Freq: Once | INTRAMUSCULAR | Status: DC

## 2018-05-25 MED ORDER — LIDOCAINE HCL (PF) 1 % IJ SOLN: 5.0000 mL | INTRAMUSCULAR | Status: DC | PRN

## 2018-05-25 MED ORDER — ALTEPLASE 2 MG IJ SOLR: 2.0000 mg | Freq: Once | INTRAMUSCULAR | Status: DC | PRN

## 2018-05-25 MED ORDER — HEPARIN SODIUM (PORCINE) 1000 UNIT/ML DIALYSIS: 1000.0000 [IU] | INTRAMUSCULAR | Status: DC | PRN

## 2018-05-25 MED ORDER — VANCOMYCIN HCL IN DEXTROSE 1-5 GM/200ML-% IV SOLN
INTRAVENOUS | Status: AC
  Filled 2018-05-25: qty 200

## 2018-05-25 NOTE — Procedures (Signed)
I was present at this dialysis session, have reviewed the session itself and made  appropriate changes.  UFG 2L 134/83 Qb 400 TDC Culture HD catheter today  Jannifer Hick MD [facility] pager [phone]   05/25/2018, 8:51 AM

## 2018-05-25 NOTE — Progress Notes (Signed)
Patient has been nauseous, vomiting, along with three events of diarrhea during this shift.  Patient believes the Rena-vit administered at 2213 is the cause of her sickness. Patient doesn't recall taking Rena-Vit previously.  She did not tolerate it at all.

## 2018-05-25 NOTE — Progress Notes (Signed)
PROGRESS NOTE  Angela Kerr EYC:[id number] DOB: 05/04/1961 DOA: 05/23/2018 PCP: Nena Polio, NP   LOS: 2 days   Brief Narrative / Interim history: 57 year old female with history of end-stage renal disease on MWF HD for the past month on dialysis only, hypertension, who presents to the hospital with fever, abdominal pain, diarrhea as well as flulike symptoms for the last few days.  Also dialysis on Friday she was complaining of chills and muscle aches as well as her eyes were hurting and a mild HA.  She was also having a sore throat and generalized weakness.  She was found to be febrile here, septic appearing, will start on broad-spectrum antibiotics and was admitted to the hospital.  Nephrology was consulted.  He also been complaining of a cough with thick greenish phlegm.  Subjective: Had multiple episodes of diarrhea after receiving some medication last night.  No nausea no vomiting.  No further abdominal pain no diarrhea. Assessment & Plan: Principal Problem:   Sepsis, unspecified organism ([place]) Active Problems:   ESRD (end stage renal disease) ([place])   Essential hypertension   Anemia due to chronic kidney disease   SIRS -Unclear source, potentially sepsis from a viral etiology given generalized symptoms, GI involvement, muscle weakness and URI type symptoms -She was started on broad-spectrum antibiotics with cefepime, metronidazole and vancomycin, continue cefepime and discontinue other medications, while cultures are pending -Urinalysis unremarkable, chest x-ray without acute findings -Respiratory virus panel and influenza are negative.  Acute hypoxic respiratory failure -Felt to have fluid overload requiring BiPAP and was taken emergently to HD last night, discussed with nephrology Dr. Johnney Ou, will not dialyze but do IV Lasix today since he is making urine to continue fluid removal  End-stage renal disease on MWF HD -Nephrology consulted, appreciate input.  She was  just started on dialysis about 4 weeks ago.  She has a temporary cath on the right side of the chest which does not look infected. -It appears that plans are in place for patient to pursue peritoneal dialysis  Hypertension -Continue labetalol    Scheduled Meds: . calcitRIOL  0.5 mcg Oral Q M,W,F-HD  . Chlorhexidine Gluconate Cloth  6 each Topical Q0600  . Chlorhexidine Gluconate Cloth  6 each Topical Q0600  . darbepoetin (ARANESP) injection - DIALYSIS  60 mcg Intravenous Q Mon-HD  . heparin injection (subcutaneous)  5,000 Units Subcutaneous Q8H  . labetalol  300 mg Oral BID  . sodium chloride flush  3 mL Intravenous Q12H   Continuous Infusions: . sodium chloride 100 mL (05/25/18 0445)  . ceFEPime (MAXIPIME) IV 1 g (05/25/18 1931)  . vancomycin     PRN Meds:.sodium chloride, acetaminophen **OR** acetaminophen, albuterol, calcium carbonate (dosed in mg elemental calcium), camphor-menthol **AND** hydrOXYzine, docusate sodium, feeding supplement (NEPRO CARB STEADY), fentaNYL (SUBLIMAZE) injection, heparin, hydrALAZINE, lidocaine (PF), lidocaine-prilocaine, ondansetron **OR** ondansetron (ZOFRAN) IV, pentafluoroprop-tetrafluoroeth, sorbitol, zolpidem  DVT prophylaxis: lovenox Code Status: Full code Family Communication: No family at bedside Disposition Plan: Home when ready  Consultants:   Nephrology  Procedures:   HD  Antimicrobials:  Vancomycin, cefepime, metronidazole 11/4 >>  Objective: Vitals:   05/25/18 1030 05/25/18 1100 05/25/18 1147 05/25/18 1636  BP: 140/76 129/78 121/79 123/77  Pulse: 86 82 88 87  Resp:   (!) 21 14  Temp:   99.5 F (37.5 C) 100.3 F (37.9 C)  TempSrc:   Oral Oral  SpO2:   97% 99%  Weight:   85 kg     Intake/Output Summary (Last  24 hours) at 05/25/2018 2040 Last data filed at 05/25/2018 1400 Gross per 24 hour  Intake 120 ml  Output 2000 ml  Net -1880 ml   Filed Weights   05/25/18 0600 05/25/18 0740 05/25/18 1147  Weight: 86.3 kg 87.1  kg 85 kg    Examination:  Constitutional: Appears tachypneic Eyes: PERRL, lids and conjunctivae normal ENMT: Mucous membranes are moist. No oropharyngeal exudates Neck: normal, supple Respiratory: clear to auscultation bilaterally, no wheezing, no crackles. Normal respiratory effort. No accessory muscle use.  Cardiovascular: Regular rate and rhythm, no murmurs / rubs / gallops. No LE edema. 2+ pedal pulses. No carotid bruits.  Abdomen: no tenderness. Bowel sounds positive.  Musculoskeletal: no clubbing / cyanosis.  HD cath without erythema or tenderness Skin: no rashes Neurologic: CN 2-12 grossly intact. Strength 5/5 in all 4.  Psychiatric: Normal judgment and insight. Alert and oriented x 3. Normal mood.    Data Reviewed: I have independently reviewed following labs and imaging studies   CBC: Recent Labs  Lab 05/23/18 0128 05/25/18 0210  WBC 2.7* 2.4*  HGB 8.8* 8.6*  HCT 27.5* 26.8*  MCV 88.7 88.4  PLT 245 263   Basic Metabolic Panel: Recent Labs  Lab 05/23/18 0128 05/24/18 0202 05/25/18 0210  NA 129* 137 133*  K 4.1 4.3 4.3  CL 89* 99 96*  CO2 23 24 22   GLUCOSE 102* 100* 103*  BUN 67* 29* 55*  CREATININE 12.36* 6.97* 10.19*  CALCIUM 7.8* 8.3* 8.2*   GFR: CrCl cannot be calculated (Unknown ideal weight.). Liver Function Tests: Recent Labs  Lab 05/23/18 0128  AST 37  ALT 20  ALKPHOS 67  BILITOT 0.7  PROT 6.2*  ALBUMIN 3.3*   Recent Labs  Lab 05/23/18 0128  LIPASE 118*   No results for input(s): AMMONIA in the last 168 hours. Coagulation Profile: No results for input(s): INR, PROTIME in the last 168 hours. Cardiac Enzymes: No results for input(s): CKTOTAL, CKMB, CKMBINDEX, TROPONINI in the last 168 hours. BNP (last 3 results) No results for input(s): PROBNP in the last 8760 hours. HbA1C: No results for input(s): HGBA1C in the last 72 hours. CBG: No results for input(s): GLUCAP in the last 168 hours. Lipid Profile: No results for input(s):  CHOL, HDL, LDLCALC, TRIG, CHOLHDL, LDLDIRECT in the last 72 hours. Thyroid Function Tests: No results for input(s): TSH, T4TOTAL, FREET4, T3FREE, THYROIDAB in the last 72 hours. Anemia Panel: No results for input(s): VITAMINB12, FOLATE, FERRITIN, TIBC, IRON, RETICCTPCT in the last 72 hours. Urine analysis:    Component Value Date/Time   COLORURINE YELLOW 05/23/2018 0510   APPEARANCEUR CLEAR 05/23/2018 0510   LABSPEC 1.008 05/23/2018 0510   PHURINE 8.0 05/23/2018 0510   GLUCOSEU NEGATIVE 05/23/2018 0510   HGBUR SMALL (A) 05/23/2018 0510   BILIRUBINUR NEGATIVE 05/23/2018 0510   KETONESUR NEGATIVE 05/23/2018 0510   PROTEINUR >=300 (A) 05/23/2018 0510   UROBILINOGEN 0.2 08/20/2017 1814   NITRITE NEGATIVE 05/23/2018 0510   LEUKOCYTESUR NEGATIVE 05/23/2018 0510   Sepsis Labs: Invalid input(s): PROCALCITONIN, LACTICIDVEN  Recent Results (from the past 240 hour(s))  Blood culture (routine x 2)     Status: None (Preliminary result)   Collection Time: 05/23/18  4:49 AM  Result Value Ref Range Status   Specimen Description BLOOD RIGHT HAND  Final   Special Requests   Final    BOTTLES DRAWN AEROBIC ONLY Blood Culture results may not be optimal due to an excessive volume of blood received in culture bottles  Culture   Final    NO GROWTH 2 DAYS Performed at [facility], [place] [address], [place], [place] [address]    Report Status PENDING  Incomplete  Blood culture (routine x 2)     Status: None (Preliminary result)   Collection Time: 05/23/18  4:50 AM  Result Value Ref Range Status   Specimen Description BLOOD RIGHT ARM  Final   Special Requests   Final    BOTTLES DRAWN AEROBIC AND ANAEROBIC Blood Culture results may not be optimal due to an excessive volume of blood received in culture bottles   Culture   Final    NO GROWTH 2 DAYS Performed at [facility], [place] [address], [place], [place] [address]    Report Status PENDING  Incomplete  Culture, Urine     Status: None     Collection Time: 05/23/18  7:46 AM  Result Value Ref Range Status   Specimen Description URINE, RANDOM  Final   Special Requests NONE  Final   Culture   Final    NO GROWTH Performed at [facility], [place] [address], [place], [place] [address]    Report Status 05/24/2018 FINAL  Final  Respiratory Panel by PCR     Status: None   Collection Time: 05/23/18  9:03 AM  Result Value Ref Range Status   Adenovirus NOT DETECTED NOT DETECTED Final   Coronavirus 229E NOT DETECTED NOT DETECTED Final   Coronavirus HKU1 NOT DETECTED NOT DETECTED Final   Coronavirus NL63 NOT DETECTED NOT DETECTED Final   Coronavirus OC43 NOT DETECTED NOT DETECTED Final   Metapneumovirus NOT DETECTED NOT DETECTED Final   Rhinovirus / Enterovirus NOT DETECTED NOT DETECTED Final   Influenza A NOT DETECTED NOT DETECTED Final   Influenza B NOT DETECTED NOT DETECTED Final   Parainfluenza Virus 1 NOT DETECTED NOT DETECTED Final   Parainfluenza Virus 2 NOT DETECTED NOT DETECTED Final   Parainfluenza Virus 3 NOT DETECTED NOT DETECTED Final   Parainfluenza Virus 4 NOT DETECTED NOT DETECTED Final   Respiratory Syncytial Virus NOT DETECTED NOT DETECTED Final   Bordetella pertussis NOT DETECTED NOT DETECTED Final   Chlamydophila pneumoniae NOT DETECTED NOT DETECTED Final   Mycoplasma pneumoniae NOT DETECTED NOT DETECTED Final    Comment: Performed at [facility], [place] [address] [place], [place] [address]  MRSA PCR Screening     Status: None   Collection Time: 05/23/18 11:19 PM  Result Value Ref Range Status   MRSA by PCR NEGATIVE NEGATIVE Final    Comment:        The GeneXpert MRSA Assay (FDA approved for NASAL specimens only), is one component of a comprehensive MRSA colonization surveillance program. It is not intended to diagnose MRSA infection nor to guide or monitor treatment for MRSA infections. Performed at [facility], [place] [address], [place], [place] [address]   Culture, blood (single)      Status: None (Preliminary result)   Collection Time: 05/24/18  3:00 PM  Result Value Ref Range Status   Specimen Description BLOOD RIGHT ANTECUBITAL  Final   Special Requests BLOOD AEROBIC BOTTLE Blood Culture adequate volume  Final   Culture   Final    NO GROWTH < 24 HOURS Performed at [facility], [place] [address] [place], [place] [address]    Report Status PENDING  Incomplete      Radiology Studies: No results found.   Electronically signed: Diamantina Providence  Posey Pronto, MD [facility] 05/25/2018 8:41 PM   If 7PM-7AM, please contact night-coverage [email or web address] Password [email or web address] 05/25/2018, 8:40 PM

## 2018-05-25 NOTE — Progress Notes (Signed)
[facility] Progress Note   Subjective:  Cough and breathing improved.  C/o nausea, emesis, diarrhea all related to a renal MVI given last night and requests it be discontinued.  She is seen on HD this AM - no HD related issues noted.   Objective Vitals:   05/25/18 0740 05/25/18 0747 05/25/18 0752 05/25/18 0815  BP: 125/79 119/81 118/81 129/84  Pulse: 80 74 76 76  Resp: (!) 24  19   Temp: 98.8 F (37.1 C)     TempSrc: Oral     SpO2: 96%     Weight: 87.1 kg      Physical Exam General: tired but nontoxic Heart: RRR Lungs:clear today; normal WOB Abdomen: soft, nontender Extremities: no edema Dialysis Access:  [facility] c/d/i  Additional Objective Labs: Basic Metabolic Panel: Recent Labs  Lab 05/23/18 0128 05/24/18 0202 05/25/18 0210  NA 129* 137 133*  K 4.1 4.3 4.3  CL 89* 99 96*  CO2 23 24 22   GLUCOSE 102* 100* 103*  BUN 67* 29* 55*  CREATININE 12.36* 6.97* 10.19*  CALCIUM 7.8* 8.3* 8.2*   Liver Function Tests: Recent Labs  Lab 05/23/18 0128  AST 37  ALT 20  ALKPHOS 67  BILITOT 0.7  PROT 6.2*  ALBUMIN 3.3*   Recent Labs  Lab 05/23/18 0128  LIPASE 118*   CBC: Recent Labs  Lab 05/23/18 0128 05/25/18 0210  WBC 2.7* 2.4*  HGB 8.8* 8.6*  HCT 27.5* 26.8*  MCV 88.7 88.4  PLT 245 188   Blood Culture    Component Value Date/Time   SDES URINE, RANDOM 05/23/2018 0746   SPECREQUEST NONE 05/23/2018 0746   CULT  05/23/2018 0746    NO GROWTH Performed at [facility], [place] [address], [place], [place] [address]    REPTSTATUS 05/24/2018 FINAL 05/23/2018 0746    Cardiac Enzymes: No results for input(s): CKTOTAL, CKMB, CKMBINDEX, TROPONINI in the last 168 hours. CBG: No results for input(s): GLUCAP in the last 168 hours. Iron Studies: No results for input(s): IRON, TIBC, TRANSFERRIN, FERRITIN in the last 72 hours. @lablastinr3 @ Studies/Results: No results found. Medications: . sodium chloride 100 mL (05/25/18 0445)  . ceFEPime  (MAXIPIME) IV Stopped (05/24/18 2000)   . calcitRIOL  0.5 mcg Oral Q M,W,F-HD  . Chlorhexidine Gluconate Cloth  6 each Topical Q0600  . Chlorhexidine Gluconate Cloth  6 each Topical Q0600  . darbepoetin (ARANESP) injection - DIALYSIS  60 mcg Intravenous Q Mon-HD  . heparin  2,000 Units Dialysis Once in dialysis  . heparin injection (subcutaneous)  5,000 Units Subcutaneous Q8H  . labetalol  300 mg Oral BID  . multivitamin  1 tablet Oral QHS  . sodium chloride flush  3 mL Intravenous Q12H   Dialysis Orders: [facility] M[facility] 4 hr 2 K 2.5 Ca bath 400/600 right IJ TDC heparin 2000 venofer 100 through 11/11 - not on ESA yet or VDRA Recent labs: hgb 8.2 10/30 11% sat 10/9 K 4.1 iPTH 1367 alb 3.8 P 6.2 Ca 8.4 prior WBC 10/23 5.7  Exit site culture 10/21 - no growth - empirically treated with 3 doses of Vanc and Fortaz for serosanguinous drainage from cath exit   Assessment/Plan: 1. Acute febrile illness  - has defervesced now.  On IV broad spectrum antibiotics per primary.  Urine cultures neg, blood cultures NGTD - requested HD line culture today.  CT a/p normal. Flu and resp viral panel neg.   2. ESRD -  MWF - HD today.  89.5kg EDW - wt this AM 87.1kg.  Will need adjusted on discharge.    3. Hypertension/volume  - Relatively normotensive since admission with some hypotension noted overnight.  On labetolol 300 bid also has amlodpine ordered on home med list at outpt HD.  Continue to hold amlodipine 10 in setting of acute illness and low BPs.  4. Anemia  - Has received most of full course of IV Fe - hold for now given fever - started ESA -Aranesp at 60 11/4.  Hb 8.6 11/6.  Stable. 5. Metabolic bone disease -  Elevated iPTH 1367  - started calcitriol at 0.5 - had been on 2.5 bath -likely will need to lower to 2.25 or 2 at d/c- has not officially been started on any binders yet but outpt P was 6.2 - will need to address when eating and feeling better and also see what her insurance will cover 6.  Nutrition -  renal diet, renal MVI discontinued for GI side effects.    Jannifer Hick MD 05/25/2018, 8:43 AM  [facility] Pager: [phone]

## 2018-05-26 LAB — CBC
HEMATOCRIT: 26.7 % — AB (ref 36.0–46.0)
Hemoglobin: 8.4 g/dL — ABNORMAL LOW (ref 12.0–15.0)
MCH: 28.3 pg (ref 26.0–34.0)
MCHC: 31.5 g/dL (ref 30.0–36.0)
MCV: 89.9 fL (ref 80.0–100.0)
Platelets: 210 10*3/uL (ref 150–400)
RBC: 2.97 MIL/uL — ABNORMAL LOW (ref 3.87–5.11)
RDW: 12.7 % (ref 11.5–15.5)
WBC: 2.6 10*3/uL — ABNORMAL LOW (ref 4.0–10.5)
nRBC: 0 % (ref 0.0–0.2)

## 2018-05-26 MED ORDER — ALLOPURINOL 100 MG PO TABS
100.0000 mg | ORAL_TABLET | Freq: Every day | ORAL | Status: DC | PRN
  Filled 2018-05-26: qty 1

## 2018-05-26 MED ORDER — CHLORHEXIDINE GLUCONATE CLOTH 2 % EX PADS
6.0000 | MEDICATED_PAD | Freq: Every day | CUTANEOUS | Status: DC
  Administered 2018-05-27: 6 via TOPICAL

## 2018-05-26 MED ORDER — FUROSEMIDE 20 MG PO TABS
20.0000 mg | ORAL_TABLET | Freq: Every day | ORAL | Status: DC
  Administered 2018-05-26 – 2018-05-27 (×2): 20 mg via ORAL
  Filled 2018-05-26: qty 1

## 2018-05-26 NOTE — Plan of Care (Signed)
Discussed plan of care for the evening with patient.  Patient is new to dialysis.  We discussed the importance of not missing any dialysis sessions.  There may be times she does not feel well and will want to skip a day, but I explained she will feel even worse if she misses dialysis.  Good teach back displayed.

## 2018-05-26 NOTE — Progress Notes (Signed)
PROGRESS NOTE  Angela Kerr WCB:[id number] DOB: May 16, 1961 DOA: 05/23/2018 PCP: Nena Polio, NP   LOS: 3 days   Brief Narrative / Interim history: 57 year old female with history of end-stage renal disease on MWF HD for the past month on dialysis only, hypertension, who presents to the hospital with fever, abdominal pain, diarrhea as well as flulike symptoms for the last few days.  Also dialysis on Friday she was complaining of chills and muscle aches as well as her eyes were hurting and a mild HA.  She was also having a sore throat and generalized weakness.  She was found to be febrile here, septic appearing, will start on broad-spectrum antibiotics and was admitted to the hospital.  Nephrology was consulted.  He also been complaining of a cough with thick greenish phlegm.  Subjective: No fever no chills this morning.  Was febrile last night.  Had drenching sweats last night as well. No other acute complaints. . Assessment & Plan: Principal Problem:   Sepsis, unspecified organism ([place]) Active Problems:   ESRD (end stage renal disease) ([place])   Essential hypertension   Anemia due to chronic kidney disease   SIRS -Unclear source, potentially sepsis from a viral etiology given generalized symptoms, GI involvement, muscle weakness and URI type symptoms -She was started on broad-spectrum antibiotics with cefepime, metronidazole and vancomycin, stop cefepime and discontinue other medications, while cultures are pending -Urinalysis unremarkable, chest x-ray without acute findings -Respiratory virus panel and influenza are negative.  Acute hypoxic respiratory failure -Felt to have fluid overload requiring BiPAP and was taken emergently to HD last night, discussed with nephrology Dr. Johnney Ou, will not dialyze but do IV Lasix today since he is making urine to continue fluid removal  End-stage renal disease on MWF HD -Nephrology consulted, appreciate input.  She was just started on  dialysis about 4 weeks ago.  She has a temporary cath on the right side of the chest which does not look infected. -It appears that plans are in place for patient to pursue peritoneal dialysis  Hypertension -Continue labetalol    Scheduled Meds: . calcitRIOL  0.5 mcg Oral Q M,W,F-HD  . Chlorhexidine Gluconate Cloth  6 each Topical Q0600  . Chlorhexidine Gluconate Cloth  6 each Topical Q0600  . darbepoetin (ARANESP) injection - DIALYSIS  60 mcg Intravenous Q Mon-HD  . furosemide  20 mg Oral Daily  . heparin injection (subcutaneous)  5,000 Units Subcutaneous Q8H  . labetalol  300 mg Oral BID  . sodium chloride flush  3 mL Intravenous Q12H   Continuous Infusions: . sodium chloride 100 mL (05/25/18 0445)   PRN Meds:.sodium chloride, acetaminophen **OR** acetaminophen, albuterol, allopurinol, calcium carbonate (dosed in mg elemental calcium), camphor-menthol **AND** hydrOXYzine, docusate sodium, feeding supplement (NEPRO CARB STEADY), heparin, hydrALAZINE, lidocaine (PF), lidocaine-prilocaine, ondansetron **OR** ondansetron (ZOFRAN) IV, pentafluoroprop-tetrafluoroeth, sorbitol, zolpidem  DVT prophylaxis: lovenox Code Status: Full code Family Communication: No family at bedside Disposition Plan: Home when ready  Consultants:   Nephrology  Procedures:   HD  Antimicrobials:  Vancomycin, cefepime, metronidazole 11/4 >>  Objective: Vitals:   05/26/18 0613 05/26/18 0755 05/26/18 1115 05/26/18 1604  BP:  104/68 129/75 126/78  Pulse:  85 89 87  Resp:    12  Temp:  98.9 F (37.2 C) 99.6 F (37.6 C) 98.3 F (36.8 C)  TempSrc:  Oral Oral Oral  SpO2:  94%  98%  Weight: 85 kg       Intake/Output Summary (Last 24 hours) at 05/26/2018 1931  Last data filed at 05/26/2018 1800 Gross per 24 hour  Intake 980 ml  Output -  Net 980 ml   Filed Weights   05/25/18 0740 05/25/18 1147 05/26/18 0613  Weight: 87.1 kg 85 kg 85 kg    Examination:  Constitutional: Appears  tachypneic Eyes: PERRL, lids and conjunctivae normal ENMT: Mucous membranes are moist. No oropharyngeal exudates Neck: normal, supple Respiratory: clear to auscultation bilaterally, no wheezing, no crackles. Normal respiratory effort. No accessory muscle use.  Cardiovascular: Regular rate and rhythm, no murmurs / rubs / gallops. No LE edema. 2+ pedal pulses. No carotid bruits.  Abdomen: no tenderness. Bowel sounds positive.  Musculoskeletal: no clubbing / cyanosis.  HD cath without erythema or tenderness Skin: no rashes Neurologic: CN 2-12 grossly intact. Strength 5/5 in all 4.  Psychiatric: Normal judgment and insight. Alert and oriented x 3. Normal mood.    Data Reviewed: I have independently reviewed following labs and imaging studies   CBC: Recent Labs  Lab 05/23/18 0128 05/25/18 0210 05/26/18 0808  WBC 2.7* 2.4* 2.6*  HGB 8.8* 8.6* 8.4*  HCT 27.5* 26.8* 26.7*  MCV 88.7 88.4 89.9  PLT 245 188 130   Basic Metabolic Panel: Recent Labs  Lab 05/23/18 0128 05/24/18 0202 05/25/18 0210  NA 129* 137 133*  K 4.1 4.3 4.3  CL 89* 99 96*  CO2 23 24 22   GLUCOSE 102* 100* 103*  BUN 67* 29* 55*  CREATININE 12.36* 6.97* 10.19*  CALCIUM 7.8* 8.3* 8.2*   GFR: CrCl cannot be calculated (Unknown ideal weight.). Liver Function Tests: Recent Labs  Lab 05/23/18 0128  AST 37  ALT 20  ALKPHOS 67  BILITOT 0.7  PROT 6.2*  ALBUMIN 3.3*   Recent Labs  Lab 05/23/18 0128  LIPASE 118*   No results for input(s): AMMONIA in the last 168 hours. Coagulation Profile: No results for input(s): INR, PROTIME in the last 168 hours. Cardiac Enzymes: No results for input(s): CKTOTAL, CKMB, CKMBINDEX, TROPONINI in the last 168 hours. BNP (last 3 results) No results for input(s): PROBNP in the last 8760 hours. HbA1C: No results for input(s): HGBA1C in the last 72 hours. CBG: No results for input(s): GLUCAP in the last 168 hours. Lipid Profile: No results for input(s): CHOL, HDL,  LDLCALC, TRIG, CHOLHDL, LDLDIRECT in the last 72 hours. Thyroid Function Tests: No results for input(s): TSH, T4TOTAL, FREET4, T3FREE, THYROIDAB in the last 72 hours. Anemia Panel: No results for input(s): VITAMINB12, FOLATE, FERRITIN, TIBC, IRON, RETICCTPCT in the last 72 hours. Urine analysis:    Component Value Date/Time   COLORURINE YELLOW 05/23/2018 0510   APPEARANCEUR CLEAR 05/23/2018 0510   LABSPEC 1.008 05/23/2018 0510   PHURINE 8.0 05/23/2018 0510   GLUCOSEU NEGATIVE 05/23/2018 0510   HGBUR SMALL (A) 05/23/2018 0510   BILIRUBINUR NEGATIVE 05/23/2018 0510   KETONESUR NEGATIVE 05/23/2018 0510   PROTEINUR >=300 (A) 05/23/2018 0510   UROBILINOGEN 0.2 08/20/2017 1814   NITRITE NEGATIVE 05/23/2018 0510   LEUKOCYTESUR NEGATIVE 05/23/2018 0510   Sepsis Labs: Invalid input(s): PROCALCITONIN, LACTICIDVEN  Recent Results (from the past 240 hour(s))  Blood culture (routine x 2)     Status: None (Preliminary result)   Collection Time: 05/23/18  4:49 AM  Result Value Ref Range Status   Specimen Description BLOOD RIGHT HAND  Final   Special Requests   Final    BOTTLES DRAWN AEROBIC ONLY Blood Culture results may not be optimal due to an excessive volume of blood received in culture bottles  Culture   Final    NO GROWTH 3 DAYS Performed at [facility], [place] [address], [place], [place] [address]    Report Status PENDING  Incomplete  Blood culture (routine x 2)     Status: None (Preliminary result)   Collection Time: 05/23/18  4:50 AM  Result Value Ref Range Status   Specimen Description BLOOD RIGHT ARM  Final   Special Requests   Final    BOTTLES DRAWN AEROBIC AND ANAEROBIC Blood Culture results may not be optimal due to an excessive volume of blood received in culture bottles   Culture   Final    NO GROWTH 3 DAYS Performed at [facility], [place] [address], [place], [place] [address]    Report Status PENDING  Incomplete  Culture, Urine     Status: None    Collection Time: 05/23/18  7:46 AM  Result Value Ref Range Status   Specimen Description URINE, RANDOM  Final   Special Requests NONE  Final   Culture   Final    NO GROWTH Performed at [facility], [place] [address] [place], [place] [address]    Report Status 05/24/2018 FINAL  Final  Respiratory Panel by PCR     Status: None   Collection Time: 05/23/18  9:03 AM  Result Value Ref Range Status   Adenovirus NOT DETECTED NOT DETECTED Final   Coronavirus 229E NOT DETECTED NOT DETECTED Final   Coronavirus HKU1 NOT DETECTED NOT DETECTED Final   Coronavirus NL63 NOT DETECTED NOT DETECTED Final   Coronavirus OC43 NOT DETECTED NOT DETECTED Final   Metapneumovirus NOT DETECTED NOT DETECTED Final   Rhinovirus / Enterovirus NOT DETECTED NOT DETECTED Final   Influenza A NOT DETECTED NOT DETECTED Final   Influenza B NOT DETECTED NOT DETECTED Final   Parainfluenza Virus 1 NOT DETECTED NOT DETECTED Final   Parainfluenza Virus 2 NOT DETECTED NOT DETECTED Final   Parainfluenza Virus 3 NOT DETECTED NOT DETECTED Final   Parainfluenza Virus 4 NOT DETECTED NOT DETECTED Final   Respiratory Syncytial Virus NOT DETECTED NOT DETECTED Final   Bordetella pertussis NOT DETECTED NOT DETECTED Final   Chlamydophila pneumoniae NOT DETECTED NOT DETECTED Final   Mycoplasma pneumoniae NOT DETECTED NOT DETECTED Final    Comment: Performed at [facility], [place] [address] [place], [place] [address]  MRSA PCR Screening     Status: None   Collection Time: 05/23/18 11:19 PM  Result Value Ref Range Status   MRSA by PCR NEGATIVE NEGATIVE Final    Comment:        The GeneXpert MRSA Assay (FDA approved for NASAL specimens only), is one component of a comprehensive MRSA colonization surveillance program. It is not intended to diagnose MRSA infection nor to guide or monitor treatment for MRSA infections. Performed at [facility], [place] [address] [place], [place] [address]   Culture, blood (single)      Status: None (Preliminary result)   Collection Time: 05/24/18  3:00 PM  Result Value Ref Range Status   Specimen Description BLOOD RIGHT ANTECUBITAL  Final   Special Requests BLOOD AEROBIC BOTTLE Blood Culture adequate volume  Final   Culture   Final    NO GROWTH 2 DAYS Performed at [facility], [place] [address], [place], [place] [address]    Report Status PENDING  Incomplete  Culture, blood (single)     Status: None (Preliminary result)   Collection Time: 05/25/18  7:50 AM  Result Value Ref Range Status   Specimen Description BLOOD HEMODIALYSIS CATHETER  Final   Special Requests   Final    BOTTLES DRAWN AEROBIC AND ANAEROBIC Blood Culture results may not be optimal due to an inadequate volume of blood received in culture bottles   Culture   Final    NO GROWTH 1 DAY Performed at [place] [address], [place], [place] [address]    Report Status PENDING  Incomplete      Radiology Studies: No results found.   Electronically signed: Berle Mull, MD [facility] 05/26/2018 7:31 PM   If 7PM-7AM, please contact night-coverage [email or web address] Password [facility] 05/26/2018, 7:31 PM

## 2018-05-26 NOTE — Progress Notes (Signed)
[facility] Progress Note   Subjective:  Febrile to 102 last PM - she overall feels improved she says.  No cough, no sinus issues, no rashes, no LE edema.    Objective Vitals:   05/26/18 0050 05/26/18 0238 05/26/18 0613 05/26/18 0755  BP: (!) 97/53   104/68  Pulse: 92   85  Resp: 20     Temp: (!) 102 F (38.9 C) 99.6 F (37.6 C)  98.9 F (37.2 C)  TempSrc: Oral Oral  Oral  SpO2: 100%   94%  Weight:   85 kg    Physical Exam General: comfortable Heart: RRR Lungs:clear today; normal WOB Abdomen: soft, nontender Extremities: no edema Dialysis Access:  [facility] c/d/i  Additional Objective Labs: Basic Metabolic Panel: Recent Labs  Lab 05/23/18 0128 05/24/18 0202 05/25/18 0210  NA 129* 137 133*  K 4.1 4.3 4.3  CL 89* 99 96*  CO2 23 24 22   GLUCOSE 102* 100* 103*  BUN 67* 29* 55*  CREATININE 12.36* 6.97* 10.19*  CALCIUM 7.8* 8.3* 8.2*   Liver Function Tests: Recent Labs  Lab 05/23/18 0128  AST 37  ALT 20  ALKPHOS 67  BILITOT 0.7  PROT 6.2*  ALBUMIN 3.3*   Recent Labs  Lab 05/23/18 0128  LIPASE 118*   CBC: Recent Labs  Lab 05/23/18 0128 05/25/18 0210  WBC 2.7* 2.4*  HGB 8.8* 8.6*  HCT 27.5* 26.8*  MCV 88.7 88.4  PLT 245 188   Blood Culture    Component Value Date/Time   SDES BLOOD RIGHT ANTECUBITAL 05/24/2018 1500   SPECREQUEST BLOOD AEROBIC BOTTLE Blood Culture adequate volume 05/24/2018 1500   CULT  05/24/2018 1500    NO GROWTH < 24 HOURS Performed at [facility], [place] [address], [place], [place] [address]    REPTSTATUS PENDING 05/24/2018 1500    Cardiac Enzymes: No results for input(s): CKTOTAL, CKMB, CKMBINDEX, TROPONINI in the last 168 hours. CBG: No results for input(s): GLUCAP in the last 168 hours. Iron Studies: No results for input(s): IRON, TIBC, TRANSFERRIN, FERRITIN in the last 72 hours. @lablastinr3 @ Studies/Results: No results found. Medications: . sodium chloride 100 mL (05/25/18 0445)  . ceFEPime  (MAXIPIME) IV 1 g (05/25/18 1931)   . calcitRIOL  0.5 mcg Oral Q M,W,F-HD  . Chlorhexidine Gluconate Cloth  6 each Topical Q0600  . darbepoetin (ARANESP) injection - DIALYSIS  60 mcg Intravenous Q Mon-HD  . furosemide  20 mg Oral Daily  . heparin injection (subcutaneous)  5,000 Units Subcutaneous Q8H  . labetalol  300 mg Oral BID  . sodium chloride flush  3 mL Intravenous Q12H   Dialysis Orders: [facility] M[facility] 4 hr 2 K 2.5 Ca bath 400/600 right IJ TDC heparin 2000 venofer 100 through 11/11 - not on ESA yet or VDRA Recent labs: hgb 8.2 10/30 11% sat 10/9 K 4.1 iPTH 1367 alb 3.8 P 6.2 Ca 8.4 prior WBC 10/23 5.7  Exit site culture 10/21 - no growth - empirically treated with 3 doses of Vanc and Fortaz for serosanguinous drainage from cath exit   Assessment/Plan: 1. Acute febrile illness  - Tmax 102 again last night.  On cefepime alone now.  Urine cultures neg, blood cultures NGTD - requested HD line culture yesterday and this is pending.  CT a/p normal. Flu and resp viral panel neg.  Overall she appears improved with no localizing symptoms, no symptoms of DVT or drug reaction either.  Care per primary.  2. ESRD -  MWF - HD today.  89.5kg EDW - wt this AM 85kg.  Will need adjusted on discharge.    3. Hypertension/volume  - Relatively normotensive since admission with some hypotension noted overnight.  On labetolol 300 bid also has amlodpine ordered on home med list at outpt HD.  Continue to hold amlodipine 10 in setting of acute illness and low BPs.  4. Anemia  - Has received most of full course of IV Fe - hold for now given fever - started ESA -Aranesp at 60 11/4.  Hb 8.6 11/6.  Stable.  Recheck tomorrow 5. Metabolic bone disease -  Elevated iPTH 1367  - started calcitriol at 0.5 - had been on 2.5 bath -likely will need to lower to 2.25 or 2 at d/c- has not officially been started on any binders yet but outpt P was 6.2 - will need to address when eating and feeling better and also see what her insurance  will cover. Check phos in AM.  6.  Nutrition - renal diet, renal MVI discontinued for GI side effects.    Jannifer Hick MD 05/26/2018, 7:59 AM  [facility] Pager: [phone]

## 2018-05-27 DIAGNOSIS — A419 Sepsis, unspecified organism: Principal | ICD-10-CM

## 2018-05-27 LAB — CBC
HEMATOCRIT: 25.5 % — AB (ref 36.0–46.0)
Hemoglobin: 7.7 g/dL — ABNORMAL LOW (ref 12.0–15.0)
MCH: 27.5 pg (ref 26.0–34.0)
MCHC: 30.2 g/dL (ref 30.0–36.0)
MCV: 91.1 fL (ref 80.0–100.0)
NRBC: 0 % (ref 0.0–0.2)
Platelets: 260 10*3/uL (ref 150–400)
RBC: 2.8 MIL/uL — ABNORMAL LOW (ref 3.87–5.11)
RDW: 12.7 % (ref 11.5–15.5)
WBC: 2.6 10*3/uL — AB (ref 4.0–10.5)

## 2018-05-27 LAB — RENAL FUNCTION PANEL
Albumin: 2.9 g/dL — ABNORMAL LOW (ref 3.5–5.0)
Anion gap: 16 — ABNORMAL HIGH (ref 5–15)
BUN: 37 mg/dL — AB (ref 6–20)
CO2: 22 mmol/L (ref 22–32)
Calcium: 7.8 mg/dL — ABNORMAL LOW (ref 8.9–10.3)
Chloride: 96 mmol/L — ABNORMAL LOW (ref 98–111)
Creatinine, Ser: 9.45 mg/dL — ABNORMAL HIGH (ref 0.44–1.00)
GFR calc Af Amer: 5 mL/min — ABNORMAL LOW (ref >60)
GFR calc non Af Amer: 4 mL/min — ABNORMAL LOW (ref >60)
GLUCOSE: 94 mg/dL (ref 70–99)
POTASSIUM: 3.1 mmol/L — AB (ref 3.5–5.1)
Phosphorus: 2.8 mg/dL (ref 2.5–4.6)
Sodium: 134 mmol/L — ABNORMAL LOW (ref 135–145)

## 2018-05-27 LAB — ABO/RH: ABO/RH(D): O POS

## 2018-05-27 LAB — PREPARE RBC (CROSSMATCH)

## 2018-05-27 MED ORDER — ALTEPLASE 2 MG IJ SOLR: 2.0000 mg | Freq: Once | INTRAMUSCULAR | Status: DC | PRN

## 2018-05-27 MED ORDER — LIDOCAINE-PRILOCAINE 2.5-2.5 % EX CREA: 1.0000 "application " | TOPICAL_CREAM | CUTANEOUS | Status: DC | PRN

## 2018-05-27 MED ORDER — ACETAMINOPHEN 325 MG PO TABS: 650.0000 mg | ORAL_TABLET | Freq: Once | ORAL | Status: DC

## 2018-05-27 MED ORDER — HEPARIN SODIUM (PORCINE) 1000 UNIT/ML DIALYSIS: 1000.0000 [IU] | INTRAMUSCULAR | Status: DC | PRN

## 2018-05-27 MED ORDER — DIPHENHYDRAMINE HCL 25 MG PO CAPS: 25.0000 mg | ORAL_CAPSULE | Freq: Once | ORAL | Status: DC

## 2018-05-27 MED ORDER — HEPARIN SODIUM (PORCINE) 1000 UNIT/ML DIALYSIS: 2000.0000 [IU] | INTRAMUSCULAR | Status: DC | PRN

## 2018-05-27 MED ORDER — PENTAFLUOROPROP-TETRAFLUOROETH EX AERO: 1.0000 "application " | INHALATION_SPRAY | CUTANEOUS | Status: DC | PRN

## 2018-05-27 MED ORDER — CALCITRIOL 0.5 MCG PO CAPS
ORAL_CAPSULE | ORAL | Status: AC
  Administered 2018-05-27: 0.5 ug via ORAL
  Filled 2018-05-27: qty 1

## 2018-05-27 MED ORDER — SODIUM CHLORIDE 0.9% IV SOLUTION: Freq: Once | INTRAVENOUS | Status: DC

## 2018-05-27 MED ORDER — LIDOCAINE HCL (PF) 1 % IJ SOLN: 5.0000 mL | INTRAMUSCULAR | Status: DC | PRN

## 2018-05-27 MED ORDER — HEPARIN SODIUM (PORCINE) 1000 UNIT/ML IJ SOLN
INTRAMUSCULAR | Status: AC
  Administered 2018-05-27: 12:00:00
  Filled 2018-05-27: qty 1

## 2018-05-27 MED ORDER — POTASSIUM CHLORIDE 20 MEQ PO PACK
20.0000 meq | PACK | Freq: Once | ORAL | Status: AC
  Administered 2018-05-27: 20 meq via ORAL
  Filled 2018-05-27: qty 1

## 2018-05-27 MED ORDER — SODIUM CHLORIDE 0.9 % IV SOLN: 100.0000 mL | INTRAVENOUS | Status: DC | PRN

## 2018-05-27 NOTE — Progress Notes (Signed)
Pt discharged to home per physician orders. Education has been addressed, pt verbalizes understanding and has no questions at this time. All personal belongings have been returned, verified by pt. Pt is leaving hospital via wheelchair.

## 2018-05-27 NOTE — Progress Notes (Signed)
[facility] Progress Note   Subjective:  Afebrile overnight.  C/o DOE when making her bed this morning.   Objective Vitals:   05/26/18 1604 05/26/18 2121 05/26/18 2328 05/27/18 0500  BP: 126/78 119/90 (!) 138/95   Pulse: 87 85 82   Resp: 12  18   Temp: 98.3 F (36.8 C)  98.9 F (37.2 C)   TempSrc: Oral  Oral   SpO2: 98%  98%   Weight:    86.2 kg   Physical Exam General: comfortable Heart: RRR Lungs:clear today; normal WOB Abdomen: soft, nontender Extremities: no edema Dialysis Access:  [facility] c/d/i  Additional Objective Labs: Basic Metabolic Panel: Recent Labs  Lab 05/24/18 0202 05/25/18 0210 05/27/18 0635  NA 137 133* 134*  K 4.3 4.3 3.1*  CL 99 96* 96*  CO2 24 22 22   GLUCOSE 100* 103* 94  BUN 29* 55* 37*  CREATININE 6.97* 10.19* 9.45*  CALCIUM 8.3* 8.2* 7.8*  PHOS  --   --  2.8   Liver Function Tests: Recent Labs  Lab 05/23/18 0128 05/27/18 0635  AST 37  --   ALT 20  --   ALKPHOS 67  --   BILITOT 0.7  --   PROT 6.2*  --   ALBUMIN 3.3* 2.9*   Recent Labs  Lab 05/23/18 0128  LIPASE 118*   CBC: Recent Labs  Lab 05/23/18 0128 05/25/18 0210 05/26/18 0808 05/27/18 0635  WBC 2.7* 2.4* 2.6* 2.6*  HGB 8.8* 8.6* 8.4* 7.7*  HCT 27.5* 26.8* 26.7* 25.5*  MCV 88.7 88.4 89.9 91.1  PLT 245 188 210 260   Blood Culture    Component Value Date/Time   SDES BLOOD HEMODIALYSIS CATHETER 05/25/2018 0750   SPECREQUEST  05/25/2018 0750    BOTTLES DRAWN AEROBIC AND ANAEROBIC Blood Culture results may not be optimal due to an inadequate volume of blood received in culture bottles   CULT  05/25/2018 0750    NO GROWTH 1 DAY Performed at [facility], [place] [address], [place], [place] [address]    REPTSTATUS PENDING 05/25/2018 0750    Cardiac Enzymes: No results for input(s): CKTOTAL, CKMB, CKMBINDEX, TROPONINI in the last 168 hours. CBG: No results for input(s): GLUCAP in the last 168 hours. Iron Studies: No results for input(s):  IRON, TIBC, TRANSFERRIN, FERRITIN in the last 72 hours. @lablastinr3 @ Studies/Results: No results found. Medications: . sodium chloride    . sodium chloride     . calcitRIOL  0.5 mcg Oral Q M,W,F-HD  . Chlorhexidine Gluconate Cloth  6 each Topical Q0600  . darbepoetin (ARANESP) injection - DIALYSIS  60 mcg Intravenous Q Mon-HD  . furosemide  20 mg Oral Daily  . heparin injection (subcutaneous)  5,000 Units Subcutaneous Q8H  . labetalol  300 mg Oral BID  . sodium chloride flush  3 mL Intravenous Q12H   Dialysis Orders: [place] MWF 4 hr 2 K 2.5 Ca bath 400/600 right IJ TDC heparin 2000 venofer 100 through 11/11 - not on ESA yet or VDRA Recent labs: hgb 8.2 10/30 11% sat 10/9 K 4.1 iPTH 1367 alb 3.8 P 6.2 Ca 8.4 prior WBC 10/23 5.7  Exit site culture 10/21 - no growth - empirically treated with 3 doses of Vanc and Fortaz for serosanguinous drainage from cath exit   Assessment/Plan: 1. Acute febrile illness  - AFebrile past 24hrs. On cefepime alone now.  Urine cultures neg, blood cultures NGTD, peripheral and HD catheter.  CT a/p normal. Flu and  resp viral panel neg.  Overall she appears improved with no localizing symptoms, no symptoms of DVT or drug reaction either.  Care per primary.  2. ESRD -  MWF - HD today.  89.5kg EDW - wt this AM 86.2kg.  Will need adjusted on discharge.    3. Hypertension/volume  - Relatively normotensive since admission.  On labetolol 300 bid also has amlodpine ordered on home med list at outpt HD.  Continue to hold amlodipine 10 in setting of acute illness and low BPs.  UFG today 2L  4. Anemia  - Has received most of full course of IV Fe - hold for now given fever - started ESA -Aranesp at 60 11/4.  Hb 8.6 11/6 > 7.7 pre HD today. No s/s bleeding. Given DOE today discussed blood transfusion at length and give 1u pRBC.  Recheck tomorrow. 5. Metabolic bone disease -  Elevated iPTH 1367  - started calcitriol at 0.5.  Hass not officially been started on any binders yet but  outpt P was 6.2 > today 2.8, no binders needed at this time.  6.  Nutrition - renal diet, renal MVI discontinued for GI side effects.    Jannifer Hick MD 05/27/2018, 8:41 AM  [facility] Pager: [phone]

## 2018-05-28 LAB — CULTURE, BLOOD (ROUTINE X 2)
Culture: NO GROWTH
Culture: NO GROWTH

## 2018-05-28 LAB — TYPE AND SCREEN
ABO/RH(D): O POS
ANTIBODY SCREEN: NEGATIVE
Unit division: 0

## 2018-05-28 LAB — BPAM RBC
BLOOD PRODUCT EXPIRATION DATE: 201912082359
ISSUE DATE / TIME: 201911081015
Unit Type and Rh: 5100

## 2018-05-29 LAB — CULTURE, BLOOD (SINGLE)
Culture: NO GROWTH
Special Requests: ADEQUATE

## 2018-05-30 LAB — CULTURE, BLOOD (SINGLE): Culture: NO GROWTH

## 2018-05-30 NOTE — Discharge Summary (Signed)
[facility] Discharge Summary   Patient: Angela Kerr UJW:[id number]   PCP: Nena Polio, NP DOB: 12/24/1960   Date of admission: 05/23/2018   Date of discharge: 05/27/2018    Discharge Diagnoses:  Principal diagnosis Sepsis secondary to possible viral illness Principal Problem:   Sepsis, unspecified organism ([place]) Active Problems:   ESRD (end stage renal disease) ([place])   Essential hypertension   Anemia due to chronic kidney disease   Admitted From: Home Disposition: Home  Recommendations for Outpatient Follow-up:  1. Please follow-up with PCP in 1 week  Follow-up Information    [facility] Follow up on 06/02/2018.   Why:  10:10 for hospital follow up and to establish as new patient.  Contact information: [place] [place] [phone]       [facility] Follow up.   Why:  you can use the pharmacy for discounted medications Mon - Fri 8a to 5pm, close on weekends Contact information: [address] [place] [phone]         Diet recommendation: Renal diet  Activity: The patient is advised to gradually reintroduce usual activities.  Discharge Condition: good  Code Status: Full code  History of present illness: As per the H and P dictated on admission, " Angela Kerr is a 57 y.o. female with medical history significant of ESRD on MWF HD, HTN presenting with fever, abdominal pain, diarrhea.  Friday, she went to HD and felt very chilled the entire time and her eyes were hurting (that started Thursday).  She came home and went to bed and has been in bed since.  She has had fever, chills, diarrhea, eye pain, pain in her LLQ, sore throat.  She has been too weak to do anything.   Fever to 101 here, but subjective at home.  Slight cough, productive of thick greenish phlegm.  Diarrhea started today before she came to the ER - 2 loose  stools at home and none since.  LLQ pain started Friday, comes and goes.  No urinary symptoms."  Hospital Course:  Summary of her active problems in the hospital is as following. Sepsis -sepsis from a viral etiology given generalized symptoms, GI involvement, muscle weakness and URI type symptoms -She was started on broad-spectrum antibiotics with cefepime, metronidazole and vancomycin,  cultures remain negative, patient was monitored off of the antibiotics for 1 day in the hospital and remained afebrile. Patient will be discharged off of any antibiotics. UA, chest x-ray, respiratory virus pathogen panel as well as influenza panel are negative although this can be falsely negative and acute set up.  Acute hypoxic respiratory failure -Felt to have fluid overload requiring BiPAP and was taken emergently to HD. -Improvement after initial HD treatment.  Outpatient follow-up with HD center.  End-stage renal disease on MWF HD -Nephrology consulted, appreciate input.  She was just started on dialysis about 4 weeks ago.  She has a temporary cath on the right side of the chest which does not look infected. -It appears that plans are in place for patient to pursue peritoneal dialysis  Hypertension -Continue labetalol  All other chronic medical condition were stable during the hospitalization.  Patient was ambulatory without any assistance. On the day of the discharge the patient's vitals were stable, and no other acute medical condition were reported by patient. the patient was felt safe to be discharge at home with family.  Consultants: Nephrology Procedures: HD  DISCHARGE MEDICATION: Allergies as of 05/27/2018      Reactions   Codeine Nausea And Vomiting   Other Other (See Comments)   Certain B/P meds cause leg pain      Medication List    TAKE these medications   albuterol 108 (90 Base) MCG/ACT inhaler Commonly known as:  PROVENTIL HFA;VENTOLIN HFA Inhale 1-2 puffs into the lungs  every 6 (six) hours as needed for wheezing.   allopurinol 100 MG tablet Commonly known as:  ZYLOPRIM Take 100 mg by mouth daily as needed (for gout flares).   furosemide 20 MG tablet Commonly known as:  LASIX Take 20 mg by mouth daily.   labetalol 300 MG tablet Commonly known as:  NORMODYNE Take 300 mg by mouth 2 (two) times daily.      Allergies  Allergen Reactions  . Codeine Nausea And Vomiting  . Other Other (See Comments)    Certain B/P meds cause leg pain   Discharge Instructions    Diet renal [id number]   Complete by:  As directed    Increase activity slowly   Complete by:  As directed      Discharge Exam: Filed Weights   05/26/18 0613 05/27/18 0500 05/27/18 1106  Weight: 85 kg 86.2 kg 85 kg   Vitals:   05/27/18 1400 05/27/18 1524  BP:  124/89  Pulse:  79  Resp:  12  Temp:  98.3 F (36.8 C)  SpO2: 96% 100%   General: Appear in no distress, no Rash; Oral Mucosa moist. Cardiovascular: S1 and S2 Present, no Murmur, no JVD Respiratory: Bilateral Air entry present and Clear to Auscultation, no Crackles, no wheezes Abdomen: Bowel Sound present, Soft and no tenderness Extremities: no Pedal edema, no calf tenderness Neurology: Grossly no focal neuro deficit.  The results of significant diagnostics from this hospitalization (including imaging, microbiology, ancillary and laboratory) are listed below for reference.    Significant Diagnostic Studies: Ct Abdomen Pelvis Wo Contrast  Result Date: 05/23/2018 CLINICAL DATA:  Bilateral eye pain, fever, chills, diarrhea, and left sided abdominal pain following dialysis 3 days ago. Hx renal disorder, HTN, gout, and abdominal hysterectomy. EXAM: CT ABDOMEN AND PELVIS WITHOUT CONTRAST TECHNIQUE: Multidetector CT imaging of the abdomen and pelvis was performed following the standard protocol without IV contrast. COMPARISON:  05/25/2016 FINDINGS: Lower chest: Lung bases are clear. Hepatobiliary: No focal liver abnormality is  seen. Status post cholecystectomy. No biliary dilatation. Pancreas: Unremarkable. No pancreatic ductal dilatation or surrounding inflammatory changes. Spleen: Normal in size without focal abnormality. Adrenals/Urinary Tract: No adrenal gland nodules. Cyst on the left kidney without change. No hydronephrosis or hydroureter. No urinary tract stones. Bladder is unremarkable. Stomach/Bowel: Stomach is within normal limits. Appendix appears normal. No evidence of bowel wall thickening, distention, or inflammatory changes. Vascular/Lymphatic: No significant vascular findings are present. No enlarged abdominal or pelvic lymph nodes. Reproductive: Status post hysterectomy. No adnexal masses. Other: Small right inguinal hernia containing fat. No free air or free fluid in the abdomen. Musculoskeletal: No acute or significant osseous findings. IMPRESSION: No acute process demonstrated in the abdomen or pelvis. Electronically Signed   By: Lucienne Capers M.D.   On: 05/23/2018 06:00   Dg Chest 2 View  Result Date: 05/23/2018 CLINICAL DATA:  Fever and chills today. EXAM: CHEST - 2 VIEW COMPARISON:  04/19/2018 FINDINGS: Interval placement of a right central venous catheter with tip over the cavoatrial junction region. No pneumothorax. Heart size and pulmonary vascularity are normal. Lungs are clear. No blunting  of costophrenic angles. No pneumothorax. Mediastinal contours appear intact. Surgical clips in the right upper quadrant. IMPRESSION: No evidence of active pulmonary disease. Electronically Signed   By: Lucienne Capers M.D.   On: 05/23/2018 05:16    Microbiology: Recent Results (from the past 240 hour(s))  Blood culture (routine x 2)     Status: None   Collection Time: 05/23/18  4:49 AM  Result Value Ref Range Status   Specimen Description BLOOD RIGHT HAND  Final   Special Requests   Final    BOTTLES DRAWN AEROBIC ONLY Blood Culture results may not be optimal due to an excessive volume of blood received in  culture bottles   Culture   Final    NO GROWTH 5 DAYS Performed at [facility], [place] [address] [place], [place] [address]    Report Status 05/28/2018 FINAL  Final  Blood culture (routine x 2)     Status: None   Collection Time: 05/23/18  4:50 AM  Result Value Ref Range Status   Specimen Description BLOOD RIGHT ARM  Final   Special Requests   Final    BOTTLES DRAWN AEROBIC AND ANAEROBIC Blood Culture results may not be optimal due to an excessive volume of blood received in culture bottles   Culture   Final    NO GROWTH 5 DAYS Performed at [facility], [place] [address] [place], [place] [address]    Report Status 05/28/2018 FINAL  Final  Culture, Urine     Status: None   Collection Time: 05/23/18  7:46 AM  Result Value Ref Range Status   Specimen Description URINE, RANDOM  Final   Special Requests NONE  Final   Culture   Final    NO GROWTH Performed at [facility], [place] [address], [place], [place] [address]    Report Status 05/24/2018 FINAL  Final  Respiratory Panel by PCR     Status: None   Collection Time: 05/23/18  9:03 AM  Result Value Ref Range Status   Adenovirus NOT DETECTED NOT DETECTED Final   Coronavirus 229E NOT DETECTED NOT DETECTED Final   Coronavirus HKU1 NOT DETECTED NOT DETECTED Final   Coronavirus NL63 NOT DETECTED NOT DETECTED Final   Coronavirus OC43 NOT DETECTED NOT DETECTED Final   Metapneumovirus NOT DETECTED NOT DETECTED Final   Rhinovirus / Enterovirus NOT DETECTED NOT DETECTED Final   Influenza A NOT DETECTED NOT DETECTED Final   Influenza B NOT DETECTED NOT DETECTED Final   Parainfluenza Virus 1 NOT DETECTED NOT DETECTED Final   Parainfluenza Virus 2 NOT DETECTED NOT DETECTED Final   Parainfluenza Virus 3 NOT DETECTED NOT DETECTED Final   Parainfluenza Virus 4 NOT DETECTED NOT DETECTED Final   Respiratory Syncytial Virus NOT DETECTED NOT DETECTED Final   Bordetella pertussis NOT DETECTED NOT DETECTED Final   Chlamydophila  pneumoniae NOT DETECTED NOT DETECTED Final   Mycoplasma pneumoniae NOT DETECTED NOT DETECTED Final    Comment: Performed at [facility], [place] [address], [place], [place] [address]  MRSA PCR Screening     Status: None   Collection Time: 05/23/18 11:19 PM  Result Value Ref Range Status   MRSA by PCR NEGATIVE NEGATIVE Final    Comment:        The GeneXpert MRSA Assay (FDA approved for NASAL specimens only), is one component of a comprehensive MRSA colonization surveillance program. It is not intended to diagnose MRSA infection nor to guide or monitor  treatment for MRSA infections. Performed at [facility], [place] [address], [place], [place] [address]   Culture, blood (single)     Status: None   Collection Time: 05/24/18  3:00 PM  Result Value Ref Range Status   Specimen Description BLOOD RIGHT ANTECUBITAL  Final   Special Requests BLOOD AEROBIC BOTTLE Blood Culture adequate volume  Final   Culture   Final    NO GROWTH 5 DAYS Performed at [facility], [place] [address] [place], [place] [address]    Report Status 05/29/2018 FINAL  Final  Culture, blood (single)     Status: None (Preliminary result)   Collection Time: 05/25/18  7:50 AM  Result Value Ref Range Status   Specimen Description BLOOD HEMODIALYSIS CATHETER  Final   Special Requests   Final    BOTTLES DRAWN AEROBIC AND ANAEROBIC Blood Culture results may not be optimal due to an inadequate volume of blood received in culture bottles   Culture   Final    NO GROWTH 4 DAYS Performed at [facility], [place] [address], [place], [place] [address]    Report Status PENDING  Incomplete     Labs: CBC: Recent Labs  Lab 05/25/18 0210 05/26/18 0808 05/27/18 0635  WBC 2.4* 2.6* 2.6*  HGB 8.6* 8.4* 7.7*  HCT 26.8* 26.7* 25.5*  MCV 88.4 89.9 91.1  PLT 188 210 654   Basic Metabolic Panel: Recent Labs  Lab 05/24/18 0202 05/25/18 0210 05/27/18 0635  NA 137 133* 134*  K 4.3 4.3 3.1*  CL 99 96* 96*    CO2 24 22 22   GLUCOSE 100* 103* 94  BUN 29* 55* 37*  CREATININE 6.97* 10.19* 9.45*  CALCIUM 8.3* 8.2* 7.8*  PHOS  --   --  2.8   Liver Function Tests: Recent Labs  Lab 05/27/18 0635  ALBUMIN 2.9*   No results for input(s): LIPASE, AMYLASE in the last 168 hours. No results for input(s): AMMONIA in the last 168 hours. Cardiac Enzymes: No results for input(s): CKTOTAL, CKMB, CKMBINDEX, TROPONINI in the last 168 hours. BNP (last 3 results) No results for input(s): BNP in the last 8760 hours. CBG: No results for input(s): GLUCAP in the last 168 hours. Time spent: 35 minutes  Signed:  Berle Mull  [facility] 05/27/2018 , 8:29 AM

## 2018-06-02 ENCOUNTER — Ambulatory Visit: Payer: Self-pay | Admitting: Family Medicine

## 2018-06-08 ENCOUNTER — Encounter: Payer: Self-pay | Admitting: Nephrology

## 2018-06-09 ENCOUNTER — Ambulatory Visit: Payer: Self-pay | Admitting: Family Medicine

## 2018-06-20 ENCOUNTER — Telehealth (HOSPITAL_COMMUNITY): Payer: Self-pay

## 2018-06-21 ENCOUNTER — Encounter: Payer: Self-pay | Admitting: *Deleted

## 2018-06-21 ENCOUNTER — Other Ambulatory Visit: Payer: Self-pay

## 2018-06-21 ENCOUNTER — Encounter: Payer: Self-pay | Admitting: Vascular Surgery

## 2018-06-21 ENCOUNTER — Ambulatory Visit (HOSPITAL_COMMUNITY)
Admission: RE | Admit: 2018-06-21 | Discharge: 2018-06-21 | Disposition: A | Discharge status: Home / Self Care | Payer: Medicare Other | Source: Ambulatory Visit | Attending: Vascular Surgery | Admitting: Vascular Surgery

## 2018-06-21 ENCOUNTER — Ambulatory Visit (INDEPENDENT_AMBULATORY_CARE_PROVIDER_SITE_OTHER)
Admission: RE | Admit: 2018-06-21 | Discharge: 2018-06-21 | Disposition: A | Discharge status: Home / Self Care | Payer: Medicare Other | Source: Ambulatory Visit | Attending: Vascular Surgery | Admitting: Vascular Surgery

## 2018-06-21 ENCOUNTER — Ambulatory Visit (INDEPENDENT_AMBULATORY_CARE_PROVIDER_SITE_OTHER): Payer: Self-pay | Admitting: Vascular Surgery

## 2018-06-21 ENCOUNTER — Other Ambulatory Visit: Payer: Self-pay | Admitting: *Deleted

## 2018-06-21 VITALS — BP 172/112 | HR 81 | Temp 98.0°F | Resp 16 | Ht 64.0 in | Wt 192.0 lb

## 2018-06-21 DIAGNOSIS — N186 End stage renal disease: Secondary | ICD-10-CM | POA: Insufficient documentation

## 2018-06-21 NOTE — H&P (View-Only) (Signed)
[facility] [place]  Patient name: Angela Kerr MRN: [id number] DOB: 01-21-1961 Sex: female  REASON FOR CONSULT: Discuss access for hemodialysis  HPI: Angela Kerr is a 57 y.o. female, who is here today for discussion of access for hemodialysis.  She currently is on hemodialysis via a right IJ catheter and has been so since September 2019.  She has had no difficulty with this.  She is right-handed.  She has had no prior arm access.  She has not had a history of pacemaker.  Past Medical History:  Diagnosis Date  . ESRD (end stage renal disease) on dialysis ([place])   . Gout   . Hypertension   . Vision loss of right eye    within the last 6 months, seeing retina specialist    Family History  Problem Relation Age of Onset  . Alcoholism Mother     SOCIAL HISTORY: Social History   Socioeconomic History  . Marital status: Divorced    Spouse name: Not on file  . Number of children: Not on file  . Years of education: Not on file  . Highest education level: Not on file  Occupational History  . Occupation: disabled  Social Needs  . Financial resource strain: Not on file  . Food insecurity:    Worry: Not on file    Inability: Not on file  . Transportation needs:    Medical: Not on file    Non-medical: Not on file  Tobacco Use  . Smoking status: Never Smoker  . Smokeless tobacco: Never Used  Substance and Sexual Activity  . Alcohol use: No  . Drug use: No  . Sexual activity: Not on file  Lifestyle  . Physical activity:    Days per week: Not on file    Minutes per session: Not on file  . Stress: Not on file  Relationships  . Social connections:    Talks on phone: Not on file    Gets together: Not on file    Attends religious service: Not on file    Active member of club or organization: Not on file    Attends meetings of clubs or organizations: Not on file    Relationship status: Not on file   . Intimate partner violence:    Fear of current or ex partner: Not on file    Emotionally abused: Not on file    Physically abused: Not on file    Forced sexual activity: Not on file  Other Topics Concern  . Not on file  Social History Narrative  . Not on file    Allergies  Allergen Reactions  . Codeine Nausea And Vomiting  . Other Other (See Comments)    Certain B/P meds cause leg pain    Current Outpatient Medications  Medication Sig Dispense Refill  . labetalol (NORMODYNE) 300 MG tablet Take 300 mg by mouth 2 (two) times daily.     No current facility-administered medications for this visit.     REVIEW OF SYSTEMS:  [X]  denotes positive finding, [ ]  denotes negative finding Cardiac  Comments:  Chest pain or chest pressure:    Shortness of breath upon exertion:    Short of breath when lying flat:    Irregular heart rhythm:        Vascular    Pain in calf, thigh, or hip brought on by ambulation:    Pain in feet at night that wakes you up from your sleep:  Blood clot in your veins:    Leg swelling:         Pulmonary    Oxygen at home:    Productive cough:     Wheezing:         Neurologic    Sudden weakness in arms or legs:     Sudden numbness in arms or legs:     Sudden onset of difficulty speaking or slurred speech:    Temporary loss of vision in one eye:     Problems with dizziness:         Gastrointestinal    Blood in stool:     Vomited blood:         Genitourinary    Burning when urinating:     Blood in urine:        Psychiatric    Major depression:         Hematologic    Bleeding problems:    Problems with blood clotting too easily:        Skin    Rashes or ulcers:        Constitutional    Fever or chills:      PHYSICAL EXAM: Vitals:   06/21/18 1116  BP: (!) 172/112  Pulse: 81  Resp: 16  Temp: 98 F (36.7 C)  TempSrc: Oral  SpO2: 100%  Weight: 192 lb (87.1 kg)  Height: 5\' 4"  (1.626 m)    GENERAL: The patient is a  well-nourished female, in no acute distress. The vital signs are documented above. CARDIOVASCULAR: 2+ radial pulses bilaterally.  Prominent antecubital veins bilaterally PULMONARY: There is good air exchange  ABDOMEN: Soft and non-tender  MUSCULOSKELETAL: There are no major deformities or cyanosis. NEUROLOGIC: No focal weakness or paresthesias are detected. SKIN: There are no ulcers or rashes noted. PSYCHIATRIC: The patient has a normal affect.  DATA:  Noninvasive arterial venous upper extremity studies revealed a phasic waveforms and normal arterial flow in both upper extremities.  She does have moderate size cephalic vein above the antecubital space bilaterally  I imaged her veins with SonoSite ultrasound.  MEDICAL ISSUES: Had long discussion with patient regarding option for hemodialysis.  Discussed AV fistula versus AV graft and the benefits and shortcomings of each of these.  She does appear to have adequate vein size for fistula attempt on the left arm.  I did discuss the possibility of non-maturation and also the potential for steal syndrome.  She understands and wishes to proceed.  Also explained that if at the time of surgery the vein was felt to be in adequate would place a Gore-Tex graft.  She is scheduled for surgery on 06/23/2018   [name], MD [facility] Tel [phone] Pager [phone]

## 2018-06-21 NOTE — Progress Notes (Signed)
[facility] [place]  Patient name: Angela Kerr MRN: [id number] DOB: [birth year] Sex: female  REASON FOR CONSULT: Discuss access for hemodialysis  HPI: Angela Kerr is a 57 y.o. female, who is here today for discussion of access for hemodialysis.  She currently is on hemodialysis via a right IJ catheter and has been so since September 2019.  She has had no difficulty with this.  She is right-handed.  She has had no prior arm access.  She has not had a history of pacemaker.  Past Medical History:  Diagnosis Date  . ESRD (end stage renal disease) on dialysis ([place])   . Gout   . Hypertension   . Vision loss of right eye    within the last 6 months, seeing retina specialist    Family History  Problem Relation Age of Onset  . Alcoholism Mother     SOCIAL HISTORY: Social History   Socioeconomic History  . Marital status: Divorced    Spouse name: Not on file  . Number of children: Not on file  . Years of education: Not on file  . Highest education level: Not on file  Occupational History  . Occupation: disabled  Social Needs  . Financial resource strain: Not on file  . Food insecurity:    Worry: Not on file    Inability: Not on file  . Transportation needs:    Medical: Not on file    Non-medical: Not on file  Tobacco Use  . Smoking status: Never Smoker  . Smokeless tobacco: Never Used  Substance and Sexual Activity  . Alcohol use: No  . Drug use: No  . Sexual activity: Not on file  Lifestyle  . Physical activity:    Days per week: Not on file    Minutes per session: Not on file  . Stress: Not on file  Relationships  . Social connections:    Talks on phone: Not on file    Gets together: Not on file    Attends religious service: Not on file    Active member of club or organization: Not on file    Attends meetings of clubs or organizations: Not on file    Relationship status: Not on file   . Intimate partner violence:    Fear of current or ex partner: Not on file    Emotionally abused: Not on file    Physically abused: Not on file    Forced sexual activity: Not on file  Other Topics Concern  . Not on file  Social History Narrative  . Not on file    Allergies  Allergen Reactions  . Codeine Nausea And Vomiting  . Other Other (See Comments)    Certain B/P meds cause leg pain    Current Outpatient Medications  Medication Sig Dispense Refill  . labetalol (NORMODYNE) 300 MG tablet Take 300 mg by mouth 2 (two) times daily.     No current facility-administered medications for this visit.     REVIEW OF SYSTEMS:  [X]  denotes positive finding, [ ]  denotes negative finding Cardiac  Comments:  Chest pain or chest pressure:    Shortness of breath upon exertion:    Short of breath when lying flat:    Irregular heart rhythm:        Vascular    Pain in calf, thigh, or hip brought on by ambulation:    Pain in feet at night that wakes you up from your sleep:  Blood clot in your veins:    Leg swelling:         Pulmonary    Oxygen at home:    Productive cough:     Wheezing:         Neurologic    Sudden weakness in arms or legs:     Sudden numbness in arms or legs:     Sudden onset of difficulty speaking or slurred speech:    Temporary loss of vision in one eye:     Problems with dizziness:         Gastrointestinal    Blood in stool:     Vomited blood:         Genitourinary    Burning when urinating:     Blood in urine:        Psychiatric    Major depression:         Hematologic    Bleeding problems:    Problems with blood clotting too easily:        Skin    Rashes or ulcers:        Constitutional    Fever or chills:      PHYSICAL EXAM: Vitals:   06/21/18 1116  BP: (!) 172/112  Pulse: 81  Resp: 16  Temp: 98 F (36.7 C)  TempSrc: Oral  SpO2: 100%  Weight: 192 lb (87.1 kg)  Height: 5\' 4"  (1.626 m)    GENERAL: The patient is a  well-nourished female, in no acute distress. The vital signs are documented above. CARDIOVASCULAR: 2+ radial pulses bilaterally.  Prominent antecubital veins bilaterally PULMONARY: There is good air exchange  ABDOMEN: Soft and non-tender  MUSCULOSKELETAL: There are no major deformities or cyanosis. NEUROLOGIC: No focal weakness or paresthesias are detected. SKIN: There are no ulcers or rashes noted. PSYCHIATRIC: The patient has a normal affect.  DATA:  Noninvasive arterial venous upper extremity studies revealed a phasic waveforms and normal arterial flow in both upper extremities.  She does have moderate size cephalic vein above the antecubital space bilaterally  I imaged her veins with SonoSite ultrasound.  MEDICAL ISSUES: Had long discussion with patient regarding option for hemodialysis.  Discussed AV fistula versus AV graft and the benefits and shortcomings of each of these.  She does appear to have adequate vein size for fistula attempt on the left arm.  I did discuss the possibility of non-maturation and also the potential for steal syndrome.  She understands and wishes to proceed.  Also explained that if at the time of surgery the vein was felt to be in adequate would place a Gore-Tex graft.  She is scheduled for surgery on 06/23/2018   [name], MD [facility] Tel [phone] Pager [phone]

## 2018-06-22 ENCOUNTER — Other Ambulatory Visit: Payer: Self-pay

## 2018-06-22 ENCOUNTER — Encounter: Payer: Self-pay | Admitting: Nephrology

## 2018-06-22 ENCOUNTER — Encounter (HOSPITAL_COMMUNITY): Payer: Self-pay | Admitting: *Deleted

## 2018-06-22 NOTE — Progress Notes (Signed)
Pt denies SOB, chest pain, and being under the care of a cardiologist. Pt denies having a stress test, echo and cardiac cath. Pt made aware to stop taking vitamins, fish oil, and herbal medications. Do not take any NSAIDs ie: Ibuprofen, Advil, Naproxen (Aleve), Motrin, BC and Goody Powder. Pt verbalized understanding of all pre-op instructions.

## 2018-06-23 ENCOUNTER — Ambulatory Visit: Payer: Self-pay | Admitting: Family Medicine

## 2018-06-23 ENCOUNTER — Ambulatory Visit (HOSPITAL_COMMUNITY): Payer: Medicare Other | Admitting: Certified Registered Nurse Anesthetist

## 2018-06-23 ENCOUNTER — Ambulatory Visit (HOSPITAL_COMMUNITY)
Admission: RE | Admit: 2018-06-23 | Discharge: 2018-06-23 | Disposition: A | Discharge status: Home / Self Care | Payer: Medicare Other | Source: Ambulatory Visit | Attending: Vascular Surgery | Admitting: Vascular Surgery

## 2018-06-23 ENCOUNTER — Encounter (HOSPITAL_COMMUNITY)
Admission: RE | Disposition: A | Discharge status: Home / Self Care | Payer: Self-pay | Source: Ambulatory Visit | Attending: Vascular Surgery

## 2018-06-23 ENCOUNTER — Encounter (HOSPITAL_COMMUNITY): Payer: Self-pay | Admitting: Surgery

## 2018-06-23 DIAGNOSIS — Z992 Dependence on renal dialysis: Secondary | ICD-10-CM | POA: Insufficient documentation

## 2018-06-23 DIAGNOSIS — N186 End stage renal disease: Secondary | ICD-10-CM | POA: Diagnosis present

## 2018-06-23 DIAGNOSIS — N185 Chronic kidney disease, stage 5: Secondary | ICD-10-CM

## 2018-06-23 DIAGNOSIS — Z79899 Other long term (current) drug therapy: Secondary | ICD-10-CM | POA: Diagnosis not present

## 2018-06-23 DIAGNOSIS — I120 Hypertensive chronic kidney disease with stage 5 chronic kidney disease or end stage renal disease: Secondary | ICD-10-CM | POA: Insufficient documentation

## 2018-06-23 HISTORY — PX: AV FISTULA PLACEMENT: SHX1204

## 2018-06-23 HISTORY — DX: Other specified postprocedural states: Z98.890

## 2018-06-23 HISTORY — DX: Other specified postprocedural states: R11.2

## 2018-06-23 HISTORY — DX: Presence of spectacles and contact lenses: Z97.3

## 2018-06-23 LAB — POCT I-STAT, CHEM 8
BUN: 32 mg/dL — ABNORMAL HIGH (ref 6–20)
Calcium, Ion: 1.13 mmol/L — ABNORMAL LOW (ref 1.15–1.40)
Chloride: 100 mmol/L (ref 98–111)
Creatinine, Ser: 8.6 mg/dL — ABNORMAL HIGH (ref 0.44–1.00)
Glucose, Bld: 79 mg/dL (ref 70–99)
HCT: 40 % (ref 36.0–46.0)
Hemoglobin: 13.6 g/dL (ref 12.0–15.0)
Potassium: 5.3 mmol/L — ABNORMAL HIGH (ref 3.5–5.1)
Sodium: 135 mmol/L (ref 135–145)
TCO2: 32 mmol/L (ref 22–32)

## 2018-06-23 SURGERY — ARTERIOVENOUS (AV) FISTULA CREATION
Anesthesia: Monitor Anesthesia Care | Laterality: Left

## 2018-06-23 MED ORDER — ONDANSETRON HCL 4 MG/2ML IJ SOLN: 4.0000 mg | Freq: Once | INTRAMUSCULAR | Status: DC | PRN

## 2018-06-23 MED ORDER — SODIUM CHLORIDE 0.9% FLUSH: 3.0000 mL | INTRAVENOUS | Status: DC | PRN

## 2018-06-23 MED ORDER — SODIUM CHLORIDE 0.9 % IV SOLN
250.0000 mL | INTRAVENOUS | Status: DC | PRN
  Administered 2018-06-23: 11:00:00 via INTRAVENOUS

## 2018-06-23 MED ORDER — MEPERIDINE HCL 50 MG/ML IJ SOLN: 6.2500 mg | INTRAMUSCULAR | Status: DC | PRN

## 2018-06-23 MED ORDER — PROPOFOL 500 MG/50ML IV EMUL
INTRAVENOUS | Status: DC | PRN
  Administered 2018-06-23: 100 ug/kg/min via INTRAVENOUS

## 2018-06-23 MED ORDER — MIDAZOLAM HCL 2 MG/2ML IJ SOLN
INTRAMUSCULAR | Status: AC
  Filled 2018-06-23: qty 2

## 2018-06-23 MED ORDER — SODIUM CHLORIDE 0.9 % IV SOLN
INTRAVENOUS | Status: AC
  Filled 2018-06-23: qty 1.2

## 2018-06-23 MED ORDER — TRAMADOL HCL 50 MG PO TABS: 50.0000 mg | ORAL_TABLET | Freq: Four times a day (QID) | ORAL | 0 refills | Status: DC | PRN

## 2018-06-23 MED ORDER — HYDROMORPHONE HCL 1 MG/ML IJ SOLN: 0.2500 mg | INTRAMUSCULAR | Status: DC | PRN

## 2018-06-23 MED ORDER — LACTATED RINGERS IV SOLN: INTRAVENOUS | Status: DC | PRN

## 2018-06-23 MED ORDER — PROPOFOL 10 MG/ML IV BOLUS
INTRAVENOUS | Status: AC
  Filled 2018-06-23: qty 20

## 2018-06-23 MED ORDER — LIDOCAINE-EPINEPHRINE 0.5 %-1:200000 IJ SOLN
INTRAMUSCULAR | Status: DC | PRN
  Administered 2018-06-23: 13 mL

## 2018-06-23 MED ORDER — FENTANYL CITRATE (PF) 250 MCG/5ML IJ SOLN
INTRAMUSCULAR | Status: DC | PRN
  Administered 2018-06-23 (×3): 25 ug via INTRAVENOUS

## 2018-06-23 MED ORDER — ONDANSETRON HCL 4 MG/2ML IJ SOLN
INTRAMUSCULAR | Status: DC | PRN
  Administered 2018-06-23: 4 mg via INTRAVENOUS

## 2018-06-23 MED ORDER — ONDANSETRON HCL 4 MG/2ML IJ SOLN
INTRAMUSCULAR | Status: AC
  Filled 2018-06-23: qty 2

## 2018-06-23 MED ORDER — SODIUM CHLORIDE 0.9 % IV SOLN
INTRAVENOUS | Status: DC | PRN
  Administered 2018-06-23: 25 ug/min via INTRAVENOUS

## 2018-06-23 MED ORDER — LIDOCAINE-EPINEPHRINE 0.5 %-1:200000 IJ SOLN
INTRAMUSCULAR | Status: AC
  Filled 2018-06-23: qty 1

## 2018-06-23 MED ORDER — LIDOCAINE 2% (20 MG/ML) 5 ML SYRINGE
INTRAMUSCULAR | Status: AC
  Filled 2018-06-23: qty 5

## 2018-06-23 MED ORDER — PROPOFOL 10 MG/ML IV BOLUS
INTRAVENOUS | Status: DC | PRN
  Administered 2018-06-23: 20 mg via INTRAVENOUS

## 2018-06-23 MED ORDER — SODIUM CHLORIDE 0.9% FLUSH: 3.0000 mL | Freq: Two times a day (BID) | INTRAVENOUS | Status: DC

## 2018-06-23 MED ORDER — SODIUM CHLORIDE 0.9 % IV SOLN
INTRAVENOUS | Status: DC | PRN
  Administered 2018-06-23: 500 mL

## 2018-06-23 MED ORDER — FENTANYL CITRATE (PF) 250 MCG/5ML IJ SOLN
INTRAMUSCULAR | Status: AC
  Filled 2018-06-23: qty 5

## 2018-06-23 MED ORDER — MIDAZOLAM HCL 2 MG/2ML IJ SOLN
INTRAMUSCULAR | Status: DC | PRN
  Administered 2018-06-23: 2 mg via INTRAVENOUS

## 2018-06-23 MED ORDER — 0.9 % SODIUM CHLORIDE (POUR BTL) OPTIME
TOPICAL | Status: DC | PRN
  Administered 2018-06-23: 1000 mL

## 2018-06-23 MED ORDER — CEFAZOLIN SODIUM 1 G IJ SOLR
INTRAMUSCULAR | Status: AC
  Filled 2018-06-23: qty 20

## 2018-06-23 MED ORDER — CEFAZOLIN SODIUM-DEXTROSE 2-3 GM-%(50ML) IV SOLR
INTRAVENOUS | Status: DC | PRN
  Administered 2018-06-23: 2 g via INTRAVENOUS

## 2018-06-23 MED ORDER — ROCURONIUM BROMIDE 50 MG/5ML IV SOSY
PREFILLED_SYRINGE | INTRAVENOUS | Status: AC
  Filled 2018-06-23: qty 20

## 2018-06-23 SURGICAL SUPPLY — 27 items
ARMBAND PINK RESTRICT EXTREMIT (MISCELLANEOUS) ×6 IMPLANT
CANISTER SUCT 3000ML PPV (MISCELLANEOUS) ×3 IMPLANT
CANNULA VESSEL 3MM 2 BLNT TIP (CANNULA) ×3 IMPLANT
CLIP LIGATING EXTRA MED SLVR (CLIP) ×3 IMPLANT
CLIP LIGATING EXTRA SM BLUE (MISCELLANEOUS) ×3 IMPLANT
COVER PROBE W GEL 5X96 (DRAPES) ×3 IMPLANT
COVER WAND RF STERILE (DRAPES) ×3 IMPLANT
DECANTER SPIKE VIAL GLASS SM (MISCELLANEOUS) ×3 IMPLANT
DERMABOND ADVANCED (GAUZE/BANDAGES/DRESSINGS) ×2
DERMABOND ADVANCED .7 DNX12 (GAUZE/BANDAGES/DRESSINGS) ×1 IMPLANT
ELECT REM PT RETURN 9FT ADLT (ELECTROSURGICAL) ×3
ELECTRODE REM PT RTRN 9FT ADLT (ELECTROSURGICAL) ×1 IMPLANT
GLOVE SS BIOGEL STRL SZ 7.5 (GLOVE) ×1 IMPLANT
GLOVE SUPERSENSE BIOGEL SZ 7.5 (GLOVE) ×2
GOWN STRL REUS W/ TWL LRG LVL3 (GOWN DISPOSABLE) ×3 IMPLANT
GOWN STRL REUS W/TWL LRG LVL3 (GOWN DISPOSABLE) ×6
KIT BASIN OR (CUSTOM PROCEDURE TRAY) ×3 IMPLANT
KIT TURNOVER KIT B (KITS) ×3 IMPLANT
NS IRRIG 1000ML POUR BTL (IV SOLUTION) ×3 IMPLANT
PACK CV ACCESS (CUSTOM PROCEDURE TRAY) ×3 IMPLANT
PAD ARMBOARD 7.5X6 YLW CONV (MISCELLANEOUS) ×6 IMPLANT
SUT PROLENE 6 0 CC (SUTURE) ×3 IMPLANT
SUT VIC AB 3-0 SH 27 (SUTURE) ×2
SUT VIC AB 3-0 SH 27X BRD (SUTURE) ×1 IMPLANT
TOWEL GREEN STERILE (TOWEL DISPOSABLE) ×3 IMPLANT
UNDERPAD 30X30 (UNDERPADS AND DIAPERS) ×3 IMPLANT
WATER STERILE IRR 1000ML POUR (IV SOLUTION) ×3 IMPLANT

## 2018-06-23 NOTE — Anesthesia Postprocedure Evaluation (Signed)
Anesthesia Post Note  Patient: Angela Kerr  Procedure(s) Performed: ARTERIOVENOUS (AV) FISTULA CREATION VERSUS GRAFT PLACEMENT ARM (Left )     Patient location during evaluation: PACU Anesthesia Type: MAC Level of consciousness: awake and alert Pain management: pain level controlled Vital Signs Assessment: post-procedure vital signs reviewed and stable Respiratory status: spontaneous breathing, nonlabored ventilation, respiratory function stable and patient connected to nasal cannula oxygen Cardiovascular status: stable and blood pressure returned to baseline Postop Assessment: no apparent nausea or vomiting Anesthetic complications: no    Last Vitals:  Vitals:   06/23/18 1315 06/23/18 1345  BP: (!) 152/90 140/84  Pulse: 77 80  Resp: 17 16  Temp:    SpO2: 100% 100%    Last Pain:  Vitals:   06/23/18 1345  TempSrc:   PainSc: 0-No pain                 Laisha Rau DAVID

## 2018-06-23 NOTE — Op Note (Signed)
    OPERATIVE REPORT  DATE OF SURGERY: 06/23/2018  PATIENT: Angela Kerr, 57 y.o. female MRN: [id number]  DOB: 11/10/1960  PRE-OPERATIVE DIAGNOSIS: End-stage renal disease  POST-OPERATIVE DIAGNOSIS:  Same  PROCEDURE: Left brachiocephalic AV fistula creation  SURGEON:  Curt Jews, M.D.  PHYSICIAN ASSISTANT: Matt Eveland, PA-C  ANESTHESIA: Local with sedation  EBL: per anesthesia record  Total I/O In: 400 [I.V.:400] Out: 5 [Blood:5]  BLOOD ADMINISTERED: none  DRAINS: none  SPECIMEN: none  COUNTS CORRECT:  YES  PATIENT DISPOSITION:  PACU - hemodynamically stable  PROCEDURE DETAILS: The patient was taken to the operating placed supine position where the area of the left arm was prepped and draped in usual sterile fashion.  SonoSite ultrasound was used to visualize the antecubital vein which was of good caliber throughout the upper arm.  The vein was smaller in the forearm.  Incision was made over the antecubital space using local anesthesia and the cephalic vein was isolated and multiple tributary branches were ligated and divided.  The vein was ligated distally and divided and brought into approximation of the brachial artery.  The patient had a moderate-sized brachial artery with no evidence of atherosclerotic disease.  The artery was occluded proximally and distally and a small arteriotomy was created.  The vein was spatulated and sewn end-to-side to the artery with a running 6-0 Prolene suture.  Clamps removed and excellent thrill was noted in the vein.  Patient did have a diminished radial pulse and did have a good Doppler flow at the radial and ulnar level with mild augmentation with occlusion of the fistula.  Wound was irrigated with saline.  Hemostasis electrocautery.  Wound was closed with 3-0 Vicryl in the subcutaneous and subcuticular tissue.  Sterile dressing was applied and the patient was transferred to the recovery room in stable condition   [name],  M.D., [facility] 06/23/2018 12:15 PM

## 2018-06-23 NOTE — Transfer of Care (Signed)
Immediate Anesthesia Transfer of Care Note  Patient: Angela Kerr  Procedure(s) Performed: ARTERIOVENOUS (AV) FISTULA CREATION VERSUS GRAFT PLACEMENT ARM (Left )  Patient Location: PACU  Anesthesia Type:MAC  Level of Consciousness: awake, alert  and patient cooperative  Airway & Oxygen Therapy: Patient Spontanous Breathing  Post-op Assessment: Report given to RN, Post -op Vital signs reviewed and stable and Patient moving all extremities  Post vital signs: Reviewed and stable  Last Vitals:  Vitals Value Taken Time  BP    Temp    Pulse    Resp    SpO2      Last Pain:  Vitals:   06/23/18 0851  TempSrc:   PainSc: 0-No pain      Patients Stated Pain Goal: 3 (53/61/44 3154)  Complications: No apparent anesthesia complications

## 2018-06-23 NOTE — Anesthesia Preprocedure Evaluation (Signed)
Anesthesia Evaluation  Patient identified by MRN, date of birth, ID band Patient awake    Reviewed: Allergy & Precautions, NPO status , Patient's Chart, lab work & pertinent test results  History of Anesthesia Complications (+) PONV  Airway Mallampati: I  TM Distance: >3 FB Neck ROM: Full    Dental   Pulmonary    Pulmonary exam normal        Cardiovascular hypertension, Pt. on medications Normal cardiovascular exam     Neuro/Psych    GI/Hepatic   Endo/Other    Renal/GU ESRF and DialysisRenal disease     Musculoskeletal   Abdominal   Peds  Hematology   Anesthesia Other Findings   Reproductive/Obstetrics                             Anesthesia Physical Anesthesia Plan  ASA: III  Anesthesia Plan: MAC   Post-op Pain Management:    Induction: Intravenous  PONV Risk Score and Plan: 3 and Ondansetron and Treatment may vary due to age or medical condition  Airway Management Planned: Simple Face Mask  Additional Equipment:   Intra-op Plan:   Post-operative Plan:   Informed Consent: I have reviewed the patients History and Physical, chart, labs and discussed the procedure including the risks, benefits and alternatives for the proposed anesthesia with the patient or authorized representative who has indicated his/her understanding and acceptance.     Plan Discussed with: CRNA and Surgeon  Anesthesia Plan Comments:         Anesthesia Quick Evaluation

## 2018-06-23 NOTE — Discharge Instructions (Signed)
° °  [facility] ° °Discharge Instructions ° °AV Fistula or Graft Surgery for Dialysis Access ° °Please refer to the following instructions for your post-procedure care. Your surgeon or physician assistant will discuss any changes with you. ° °Activity ° °You may drive the day following your surgery, if you are comfortable and no longer taking prescription pain medication. Resume full activity as the soreness in your incision resolves. ° °Bathing/Showering ° °You may shower after you go home. Keep your incision dry for 48 hours. Do not soak in a bathtub, hot tub, or swim until the incision heals completely. You may not shower if you have a hemodialysis catheter. ° °Incision Care ° °Clean your incision with mild soap and water after 48 hours. Pat the area dry with a clean towel. You do not need a bandage unless otherwise instructed. Do not apply any ointments or creams to your incision. You may have skin glue on your incision. Do not peel it off. It will come off on its own in about one week. Your arm may swell a bit after surgery. To reduce swelling use pillows to elevate your arm so it is above your heart. Your doctor will tell you if you need to lightly wrap your arm with an ACE bandage. ° °Diet ° °Resume your normal diet. There are not special food restrictions following this procedure. In order to heal from your surgery, it is CRITICAL to get adequate nutrition. Your body requires vitamins, minerals, and protein. Vegetables are the best source of vitamins and minerals. Vegetables also provide the perfect balance of protein. Processed food has little nutritional value, so try to avoid this. ° °Medications ° °Resume taking all of your medications. If your incision is causing pain, you may take over-the counter pain relievers such as acetaminophen (Tylenol). If you were prescribed a stronger pain medication, please be aware these medications can cause nausea and constipation. Prevent  nausea by taking the medication with a snack or meal. Avoid constipation by drinking plenty of fluids and eating foods with high amount of fiber, such as fruits, vegetables, and grains. Do not take Tylenol if you are taking prescription pain medications. ° ° ° ° °Follow up °Your surgeon may want to see you in the office following your access surgery. If so, this will be arranged at the time of your surgery. ° °Please call us immediately for any of the following conditions: ° °Increased pain, redness, drainage (pus) from your incision site °Fever of 101 degrees or higher °Severe or worsening pain at your incision site °Hand pain or numbness. ° °Reduce your risk of vascular disease: ° °Stop smoking. If you would like help, call [facility] at 1-800-QUIT-NOW (1-800-784-8669) or [place] at 336-586-4000 ° °Manage your cholesterol °Maintain a desired weight °Control your diabetes °Keep your blood pressure down ° °Dialysis ° °It will take several weeks to several months for your new dialysis access to be ready for use. Your surgeon will determine when it is OK to use it. Your nephrologist will continue to direct your dialysis. You can continue to use your Permcath until your new access is ready for use. ° °If you have any questions, please call the office at [phone]. ° °

## 2018-06-23 NOTE — Interval H&P Note (Signed)
History and Physical Interval Note:  06/23/2018 10:21 AM  Angela Kerr  has presented today for surgery, with the diagnosis of end stage renal disease  The various methods of treatment have been discussed with the patient and family. After consideration of risks, benefits and other options for treatment, the patient has consented to  Procedure(s): ARTERIOVENOUS (AV) FISTULA CREATION VERSUS GRAFT PLACEMENT ARM (Left) as a surgical intervention .  The patient's history has been reviewed, patient examined, no change in status, stable for surgery.  I have reviewed the patient's chart and labs.  Questions were answered to the patient's satisfaction.     Curt Jews

## 2018-06-24 ENCOUNTER — Encounter (HOSPITAL_COMMUNITY): Payer: Self-pay | Admitting: Vascular Surgery

## 2018-06-24 ENCOUNTER — Telehealth: Payer: Self-pay | Admitting: Vascular Surgery

## 2018-06-24 NOTE — Telephone Encounter (Signed)
-----   Message from Dagoberto Ligas, PA-C sent at 06/23/2018 12:09 PM EST -----  Can you schedule an appt for this pt with fistula duplex in 4-6 weeks to see PA.  PO L brachiocephalic fistula. Thanks, [facility]

## 2018-06-24 NOTE — Telephone Encounter (Signed)
sch appt lvm mld ltr 08/04/2018 2pm Dialysis Duplex 3pm p/o PA

## 2018-06-26 NOTE — Addendum Note (Signed)
Addendum  created 06/26/18 0558 by Lillia Abed, MD   Attestation recorded in [place], [place] filed

## 2018-06-28 ENCOUNTER — Other Ambulatory Visit: Payer: Self-pay

## 2018-06-28 ENCOUNTER — Ambulatory Visit (INDEPENDENT_AMBULATORY_CARE_PROVIDER_SITE_OTHER): Payer: Self-pay | Admitting: Family

## 2018-06-28 ENCOUNTER — Encounter: Payer: Self-pay | Admitting: Family

## 2018-06-28 VITALS — BP 134/96 | HR 97 | Temp 98.4°F | Resp 18 | Ht 64.0 in | Wt 191.0 lb

## 2018-06-28 DIAGNOSIS — N186 End stage renal disease: Secondary | ICD-10-CM

## 2018-06-28 DIAGNOSIS — I770 Arteriovenous fistula, acquired: Secondary | ICD-10-CM

## 2018-06-28 DIAGNOSIS — Z992 Dependence on renal dialysis: Secondary | ICD-10-CM

## 2018-06-28 DIAGNOSIS — L299 Pruritus, unspecified: Secondary | ICD-10-CM

## 2018-06-28 NOTE — Telephone Encounter (Signed)
Pt. S/p fistula C/o rash around incision and side of arm that is blistered. Appt. Made for her to see NP today.

## 2018-06-28 NOTE — Progress Notes (Signed)
CC: pruritus at left AC incision, s/p left UE AVF creation   History of Present Illness  Angela Kerr is a 57 y.o. (Nov 25, 1960) female who is s/p Left brachiocephalic AV fistula creation on 06-23-18 by Dr. Donnetta Hutching for ESRD.   She is scheduled to return on 08-04-18 to the PA clinic for post op check.   She returns today with c/o pruritus at incision, left ac, that started immediately after the surgery, she feels like the itching is worsening. She denies dyspnea, denies swelling of her tongue or lips. She denies fever or chills.  She denies steal type sx's in her left hand.   She dialyzes MWF via right IJ TDC.    Past Medical History:  Diagnosis Date  . ESRD (end stage renal disease) on dialysis ([place])   . Gout   . Hypertension   . PONV (postoperative nausea and vomiting)   . Vision loss of right eye    within the last 6 months, seeing retina specialist  . Wears glasses     Social History Social History   Tobacco Use  . Smoking status: Never Smoker  . Smokeless tobacco: Never Used  Substance Use Topics  . Alcohol use: No  . Drug use: No    Family History Family History  Problem Relation Age of Onset  . Alcoholism Mother     Surgical History Past Surgical History:  Procedure Laterality Date  . ABDOMINAL HYSTERECTOMY    . AV FISTULA PLACEMENT Left 06/23/2018   Procedure: ARTERIOVENOUS (AV) FISTULA CREATION VERSUS GRAFT PLACEMENT ARM;  Surgeon: [name], MD;  Location: [facility];  Service: Vascular;  Laterality: Left;  . BREAST SURGERY     b/l lumpectomy  . CHOLECYSTECTOMY    . COLONOSCOPY      Allergies  Allergen Reactions  . Other Other (See Comments)    Certain B/P meds cause leg pain  . Codeine Nausea And Vomiting    Current Outpatient Medications  Medication Sig Dispense Refill  . labetalol (NORMODYNE) 300 MG tablet Take 300 mg by mouth 2 (two) times daily.    . traMADol (ULTRAM) 50 MG tablet Take 1 tablet (50 mg total) by mouth every 6 (six)  hours as needed. (Patient not taking: Reported on 06/28/2018) 12 tablet 0   No current facility-administered medications for this visit.      REVIEW OF SYSTEMS: see HPI for pertinent positives and negatives    PHYSICAL EXAMINATION:  Vitals:   06/28/18 1353  BP: (!) 134/96  Pulse: 97  Resp: 18  Temp: 98.4 F (36.9 C)  TempSrc: Oral  SpO2: 99%  Weight: 191 lb (86.6 kg)  Height: 5\' 4"  (1.626 m)   Body mass index is 32.79 kg/m.  General: Obese female appears her stated age.   HEENT:  No gross abnormalities Pulmonary: Respirations are non-labored Abdomen: Soft and non-tender  Musculoskeletal: There are no major deformities.   Neurologic: No focal weakness or paresthesias are detected Skin: Multiple small pruritic blisters with mild erythema surrounding the mostly healed incision at left AC. See photo below.  Psychiatric: The patient has normal affect. Cardiovascular: There is a regular rate and rhythm. Left upper arm AVF with palpable thril and audible bruit. Bilateral hand grasp is 5/5, 1+ palpable left radial pulse.    Left antecubital space    Medical Decision Making  Angela Kerr is a 57 y.o. female who is s/p Left brachiocephalic AV fistula creation on 06-23-18 by Dr. Donnetta Hutching for  ESRD.  She has a localized allergic reaction surrounding her left AC incision, likely a reaction to the Dermabond.    She is scheduled to return on 08-04-18 to the PA clinic for post op check.   I discussed with Dr. Donnetta Hutching pt local pruritic reaction, likely to Dermabond, no systemic allergic reaction. I advised pt to wash left ac area with antibacterial soap and water to help remove any residue, and take Benadryl to help with the itching, at night, and only if she is not going to drive or operate machinery.  Return as scheduled on 08-08-18 in PA clinic for post op check.     Clemon Chambers, RN, MSN, FNP-C [facility] Office:  [phone]  06/28/2018, 2:10 PM  Clinic MD: [place]

## 2018-08-01 ENCOUNTER — Other Ambulatory Visit: Payer: Self-pay

## 2018-08-01 DIAGNOSIS — N186 End stage renal disease: Secondary | ICD-10-CM

## 2018-08-01 DIAGNOSIS — Z992 Dependence on renal dialysis: Principal | ICD-10-CM

## 2018-08-03 NOTE — Progress Notes (Signed)
  POST OPERATIVE OFFICE NOTE    CC:  F/u for surgery  HPI:  This is a 58 y.o. female who is s/p Left BC AVF by Dr. Donnetta Hutching on 06/23/18.   She currently dialyzes via a right IJ tunneled dialysis catheter that was placed by interventional radiology (Dr. Earleen Newport) on 04/19/18.  She is right handed.   She states that she has numbness in her left 1st, middle and ring finger and pain in the middle finger at night that has been present since surgery.  She states that the pain in her middle finger wakes her up at night.  It is not as bad during the day.  She states that she had a rash around her incision after surgery that was thought to be from the Dermabond.    She dialyzes M/W/F at the [facility] on [address].   Allergies  Allergen Reactions  . Other Other (See Comments)    Certain B/P meds cause leg pain  . Codeine Nausea And Vomiting    Current Outpatient Medications  Medication Sig Dispense Refill  . labetalol (NORMODYNE) 300 MG tablet Take 300 mg by mouth 2 (two) times daily.    . traMADol (ULTRAM) 50 MG tablet Take 1 tablet (50 mg total) by mouth every 6 (six) hours as needed. (Patient not taking: Reported on 06/28/2018) 12 tablet 0   No current facility-administered medications for this visit.      ROS:  See HPI  Physical Exam:  Today's Vitals   08/04/18 1437  BP: 136/88  Pulse: 77  Resp: 16  Temp: 98.3 F (36.8 C)  TempSrc: Oral  SpO2: 96%  Weight: 191 lb (86.6 kg)  Height: 5\' 4"  (1.626 m)  PainSc: 7    Body mass index is 32.79 kg/m.  Incision:  Nicely healed Extremities:  Excellent thrill in the fistula and easily palpable; she does have a palpable left radial pulse.  There is a palmar arch doppler signal present as well as ulnar and radial; these do slightly augment with compression of the fistula.   Her motor is in tact.   Dialysis Duplex 08/04/2018: Diameter:  0.57-0.85 Depth:  0.26-0.56   Assessment/Plan:  This is a 58 y.o. female who is s/p: Left BC  AVF by Dr. Donnetta Hutching on 06/23/18  -pt fistula is maturing nicely.  She does have complaints of numbness in her 1st three fingers and pain in the middle finger mostly at night.  This does not worsen on dialysis.  Her motor is in tact and there are no wounds on her fingers. She does not have any issues with the thumb or pinky. -pt examined with Dr. Elberta Fortis she has a palpable radial pulse, this may be due to nerve irritation.  Discussed with pt giving this some time and following up with Dr. Donnetta Hutching in 6 weeks to see how she is doing.  Discussed with her that if this worsened or if she had any motor issues to call us sooner.  She is in agreement with this plan   Leontine Locket, PA-C [facility] [phone]  Clinic MD:  Pt seen with Dr. Scot Dock

## 2018-08-04 ENCOUNTER — Ambulatory Visit (INDEPENDENT_AMBULATORY_CARE_PROVIDER_SITE_OTHER): Payer: Self-pay | Admitting: Physician Assistant

## 2018-08-04 ENCOUNTER — Other Ambulatory Visit: Payer: Self-pay

## 2018-08-04 ENCOUNTER — Ambulatory Visit (HOSPITAL_COMMUNITY)
Admission: RE | Admit: 2018-08-04 | Discharge: 2018-08-04 | Disposition: A | Discharge status: Home / Self Care | Payer: Medicare Other | Source: Ambulatory Visit | Attending: Family | Admitting: Family

## 2018-08-04 VITALS — BP 136/88 | HR 77 | Temp 98.3°F | Resp 16 | Ht 64.0 in | Wt 191.0 lb

## 2018-08-04 DIAGNOSIS — N186 End stage renal disease: Secondary | ICD-10-CM

## 2018-08-04 DIAGNOSIS — Z992 Dependence on renal dialysis: Secondary | ICD-10-CM | POA: Insufficient documentation

## 2018-09-05 ENCOUNTER — Ambulatory Visit (INDEPENDENT_AMBULATORY_CARE_PROVIDER_SITE_OTHER): Payer: Medicare Other | Admitting: Cardiology

## 2018-09-05 ENCOUNTER — Encounter: Payer: Self-pay | Admitting: Cardiology

## 2018-09-05 VITALS — BP 142/76 | HR 85 | Ht 64.0 in | Wt 198.0 lb

## 2018-09-05 DIAGNOSIS — R079 Chest pain, unspecified: Secondary | ICD-10-CM | POA: Diagnosis not present

## 2018-09-05 DIAGNOSIS — I10 Essential (primary) hypertension: Secondary | ICD-10-CM | POA: Diagnosis not present

## 2018-09-05 NOTE — Progress Notes (Signed)
Cardiology Office Note:    Date:  09/05/2018   ID:  Angela Kerr, DOB 05-25-1961, MRN [id number]  PCP:  Kristie Cowman, MD  Cardiologist:  Buford Dresser, MD PhD  Referring MD: Rexene Agent, MD   CC: chest discomfort  History of Present Illness:    Angela Kerr is a 58 y.o. female with a hx of ESRD on dialysis (currently through right IJ tunneled catheter, awaiting maturity of left BC AV fistula), hypertension who is seen as a new consult at the request of Rexene Agent, MD for the evaluation and management of chest pain.  Per notes received from Dr. Joelyn Oms at [facility] clinic dates 08/24/18, [name] was having "some noncardiac CP, seems MSK, ?pericarditis she would like cardiology evaluation."  Concerns today: having severe left hand pain, worried it is gout. Has had gout multiple other joints, feels similar to the pain she gets in her elbow when she gets gout. Just started yesterday.  Chest pain: -Initial onset, quality, duration, aggravating/alleviating factors: -Started 1-2 mos ago. Had a cough, but this pain has continued since then. Feels like her ribcage is bruised. Mild pain when she pushes on her chest. Feels like an elephant sitting on her chest, worst with bending over, laying flat in her bed at night, worse with coughing. No recent severe URI. Doesn't come and go, always somewhat present, never completely goes away. No clear alleviating factors. Not better with heating pad. Nonexertional. -Associated symptoms: no shortness of breath, no diaphoresis, no fevers/chills. No syncope. Had nausea last week but not clearly linked to her pain (has had some discomfort when eating, feels like she will vomit it back up).  -Prior cardiac history: no prior heart issues -Prior ECG: normal -Prior workup: none -Prior treatment: none -Alcohol: very rare -Tobacco: never -Comorbidities: ESRD (since 03/2018), HTN -Exercise level: does all ADLs,  involved and active in her church, no intentional exercise but cleans house, climbs stairs, etc. No limitations. Plays and keeps up with her granddaughter. -Cardiac ROS: no shortness of breath, no PND, no orthopnea, no LE edema, no syncope -Family history: no heart issues in her family. No other kidney issues. Mother was an alcoholic, lived away (she lived with her grandmother). No CVAs.   Past Medical History:  Diagnosis Date  . ESRD (end stage renal disease) on dialysis ([place])   . Gout   . Hypertension   . PONV (postoperative nausea and vomiting)   . Vision loss of right eye    within the last 6 months, seeing retina specialist  . Wears glasses     Past Surgical History:  Procedure Laterality Date  . ABDOMINAL HYSTERECTOMY    . AV FISTULA PLACEMENT Left 06/23/2018   Procedure: ARTERIOVENOUS (AV) FISTULA CREATION VERSUS GRAFT PLACEMENT ARM;  Surgeon: [name], MD;  Location: [place];  Service: Vascular;  Laterality: Left;  . BREAST SURGERY     b/l lumpectomy  . CHOLECYSTECTOMY    . COLONOSCOPY      Current Medications: Current Outpatient Medications on File Prior to Visit  Medication Sig  . labetalol (NORMODYNE) 300 MG tablet Take 300 mg by mouth 2 (two) times daily.  Marland Kitchen RENAGEL 800 MG tablet Take 800 mg by mouth 3 (three) times a week.    No current facility-administered medications on file prior to visit.      Allergies:   Other and Codeine   Social History   Socioeconomic History  . Marital status: Divorced  Spouse name: Not on file  . Number of children: Not on file  . Years of education: Not on file  . Highest education level: Not on file  Occupational History  . Occupation: disabled  Social Needs  . Financial resource strain: Not on file  . Food insecurity:    Worry: Not on file    Inability: Not on file  . Transportation needs:    Medical: Not on file    Non-medical: Not on file  Tobacco Use  . Smoking status: Never Smoker  . Smokeless tobacco: Never  Used  Substance and Sexual Activity  . Alcohol use: No  . Drug use: No  . Sexual activity: Not on file  Lifestyle  . Physical activity:    Days per week: Not on file    Minutes per session: Not on file  . Stress: Not on file  Relationships  . Social connections:    Talks on phone: Not on file    Gets together: Not on file    Attends religious service: Not on file    Active member of club or organization: Not on file    Attends meetings of clubs or organizations: Not on file    Relationship status: Not on file  Other Topics Concern  . Not on file  Social History Narrative  . Not on file     Family History: The patient's family history includes Alcoholism in her mother; Diabetes in her maternal grandmother. No history of ASCVD.  ROS:   Please see the history of present illness.  Additional pertinent ROS:  Constitutional: Negative for chills, fever, night sweats, unintentional weight loss  HENT: Negative for ear pain and hearing loss.   Eyes: Negative for loss of vision and eye pain.  Respiratory: Negative for cough, sputum, shortness of breath, wheezing.   Cardiovascular: See HPI. Gastrointestinal: Negative for abdominal pain, melena, and hematochezia.  Genitourinary: Negative for dysuria and hematuria.  Musculoskeletal: Negative for falls, does have left wrist pain. Skin: Negative for itching and rash.  Neurological: Negative for focal weakness, focal sensory changes and loss of consciousness.  Endo/Heme/Allergies: Does not bruise/bleed easily.    EKGs/Labs/Other Studies Reviewed:    The following studies were reviewed today: Prior notes. No prior cardiac evaluation.   EKG:  EKG is personally reviewed.  The ekg ordered today demonstrates NSR, left axis deviation, poor R wave progression. Similar to prior  Recent Labs: 05/23/2018: ALT 20 05/27/2018: Platelets 260 06/23/2018: BUN 32; Creatinine, Ser 8.60; Hemoglobin 13.6; Potassium 5.3; Sodium 135  Recent Lipid  Panel No results found for: CHOL, TRIG, HDL, CHOLHDL, VLDL, LDLCALC, LDLDIRECT  Physical Exam:    VS:  BP (!) 142/76   Pulse 85   Ht 5\' 4"  (1.626 m)   Wt 198 lb (89.8 kg)   BMI 33.99 kg/m     Wt Readings from Last 3 Encounters:  09/05/18 198 lb (89.8 kg)  08/04/18 191 lb (86.6 kg)  06/28/18 191 lb (86.6 kg)     GEN: Well nourished, well developed in no acute distress HEENT: Normal NECK: No JVD; No carotid bruits LYMPHATICS: No lymphadenopathy CARDIAC: regular rhythm, normal S1 and S2, no murmurs, rubs, gallops. Radial and DP pulses 2+ bilaterally. RESPIRATORY:  Clear to auscultation without rales, wheezing or rhonchi  ABDOMEN: Soft, non-tender, non-distended MUSCULOSKELETAL:  No edema; left dorsal aspect over wrist area with mild swelling and palpable fluid, but no erythema or warmth. SKIN: Warm and dry NEUROLOGIC:  Alert and oriented x  3 PSYCHIATRIC:  Normal affect   ASSESSMENT:    1. Chest pain, unspecified type   2. Essential hypertension    PLAN:    1. Chest pain: atypical for ischemia. Suspect possible MSK cause vs. Irritation, but given recent dialysis start, reasonable to rule out structural heart disease -if echo unremarkable, would not work up further -if echo with abnormal function/wall motion abnormalities, would pursue ischemic workup  2. Hypertension: elevated today, but reports better control at dialysis. Will defer adjustments at this time, only on labetalol.   Plan for follow up: 3 mos  Medication Adjustments/Labs and Tests Ordered: Current medicines are reviewed at length with the patient today.  Concerns regarding medicines are outlined above.  Orders Placed This Encounter  Procedures  . EKG 12-Lead  . ECHOCARDIOGRAM COMPLETE   No orders of the defined types were placed in this encounter.   Patient Instructions  Medication Instructions:  Your Physician recommend you make the following changes to your medication.  If you need a refill on  your cardiac medications before your next appointment, please call your pharmacy.   Lab work: None  Testing/Procedures: Your physician has requested that you have an echocardiogram. Echocardiography is a painless test that uses sound waves to create images of your heart. It provides your doctor with information about the size and shape of your heart and how well your heart's chambers and valves are working. This procedure takes approximately one hour. There are no restrictions for this procedure. [address]   Follow-Up: At [facility], you and your health needs are our priority.  As part of our continuing mission to provide you with exceptional heart care, we have created designated Provider Care Teams.  These Care Teams include your primary Cardiologist (physician) and Advanced Practice Providers (APPs -  Physician Assistants and Nurse Practitioners) who all work together to provide you with the care you need, when you need it. You will need a follow up appointment in 3 months.  Please call our office 2 months in advance to schedule this appointment.  You may see Buford Dresser, MD or one of the following Advanced Practice Providers on your designated Care Team:   Rosaria Ferries, PA-C . Jory Sims, DNP, ANP        Signed, Buford Dresser, MD PhD 09/05/2018 11:44 AM    [place]

## 2018-09-05 NOTE — Patient Instructions (Signed)
Medication Instructions:  Your Physician recommend you make the following changes to your medication.  If you need a refill on your cardiac medications before your next appointment, please call your pharmacy.   Lab work: None  Testing/Procedures: Your physician has requested that you have an echocardiogram. Echocardiography is a painless test that uses sound waves to create images of your heart. It provides your doctor with information about the size and shape of your heart and how well your heart's chambers and valves are working. This procedure takes approximately one hour. There are no restrictions for this procedure. [address]   Follow-Up: At [facility], you and your health needs are our priority.  As part of our continuing mission to provide you with exceptional heart care, we have created designated Provider Care Teams.  These Care Teams include your primary Cardiologist (physician) and Advanced Practice Providers (APPs -  Physician Assistants and Nurse Practitioners) who all work together to provide you with the care you need, when you need it. You will need a follow up appointment in 3 months.  Please call our office 2 months in advance to schedule this appointment.  You may see Buford Dresser, MD or one of the following Advanced Practice Providers on your designated Care Team:   Rosaria Ferries, PA-C . Jory Sims, DNP, ANP

## 2018-09-13 ENCOUNTER — Ambulatory Visit: Payer: Medicaid Other | Admitting: Vascular Surgery

## 2018-09-20 ENCOUNTER — Ambulatory Visit: Payer: Medicaid Other | Admitting: Vascular Surgery

## 2018-09-20 ENCOUNTER — Other Ambulatory Visit (HOSPITAL_COMMUNITY): Payer: Medicaid Other

## 2018-09-21 ENCOUNTER — Encounter: Payer: Self-pay | Admitting: Family

## 2018-09-30 ENCOUNTER — Telehealth: Payer: Self-pay

## 2018-09-30 NOTE — Telephone Encounter (Signed)
New message    Just an FYI. We have made several attempts to contact this patient including sending a letter to schedule or reschedule their echocardiogram. We will be removing the patient from the echo WQ.   Thank you 

## 2018-09-30 NOTE — Telephone Encounter (Signed)
Forward to Dr Harrell Gave

## 2018-10-18 ENCOUNTER — Other Ambulatory Visit: Payer: Self-pay

## 2018-10-18 ENCOUNTER — Encounter: Payer: Self-pay | Admitting: Vascular Surgery

## 2018-10-18 ENCOUNTER — Ambulatory Visit (INDEPENDENT_AMBULATORY_CARE_PROVIDER_SITE_OTHER): Payer: Medicaid Other | Admitting: Vascular Surgery

## 2018-10-18 VITALS — BP 143/82 | HR 83 | Temp 97.4°F | Resp 18 | Ht 64.0 in | Wt 196.0 lb

## 2018-10-18 DIAGNOSIS — N186 End stage renal disease: Secondary | ICD-10-CM

## 2018-10-18 DIAGNOSIS — Z992 Dependence on renal dialysis: Secondary | ICD-10-CM

## 2018-10-18 NOTE — Progress Notes (Signed)
   Patient name: Angela Kerr MRN: [id number] DOB: [birth year] Sex: female  REASON FOR VISIT: Low up left upper arm AV fistula  HPI: Angela Kerr is a 58 y.o. female here for follow-up.  She had undergone fistula placement on December 2019.  On her initial follow-up she was having some numbness and tingling in her left hand.  This was worse at night.  She is here today for continued follow-up.  She reports that the pain at night is completely resolved.  She does have some numbness on the tips of her second third and fourth fingers.  She does denies any weakness in her left hand.  She is not having any increased pain on hemodialysis  Current Outpatient Medications  Medication Sig Dispense Refill  . RENAGEL 800 MG tablet Take 800 mg by mouth 3 (three) times a week.     . labetalol (NORMODYNE) 300 MG tablet Take 300 mg by mouth 2 (two) times daily.     No current facility-administered medications for this visit.      PHYSICAL EXAM: Vitals:   10/18/18 1417  BP: (!) 143/82  Pulse: 83  Resp: 18  Temp: (!) 97.4 F (36.3 C)  SpO2: 100%  Weight: 196 lb (88.9 kg)  Height: 5\' 4"  (1.626 m)    GENERAL: The patient is a well-nourished female, in no acute distress. The vital signs are documented above. Palpable left radial pulse which does augment with compression of the fistula.  Very large well-developed fistula which is superficial and runs in a straight course in her upper arm  MEDICAL ISSUES: Stable overall.  No evidence of steal.  She should be ready to use her fistula at any time.  She is dialyzing via right IJ catheter.  After several successful sessions with her fistula would recommend removal of the cath   [name], MD [facility] Tel [phone] Pager [phone]

## 2019-02-15 ENCOUNTER — Other Ambulatory Visit: Payer: Self-pay | Admitting: Family Medicine

## 2019-02-15 DIAGNOSIS — Z1231 Encounter for screening mammogram for malignant neoplasm of breast: Secondary | ICD-10-CM

## 2019-06-15 IMAGING — CR DG CHEST 2V
2 series · 2 of 2 positions shown · non-contrast
Comparison: 04/19/2018

CLINICAL DATA: Fever and chills today.

EXAM:
CHEST - 2 VIEW

[chest pa]
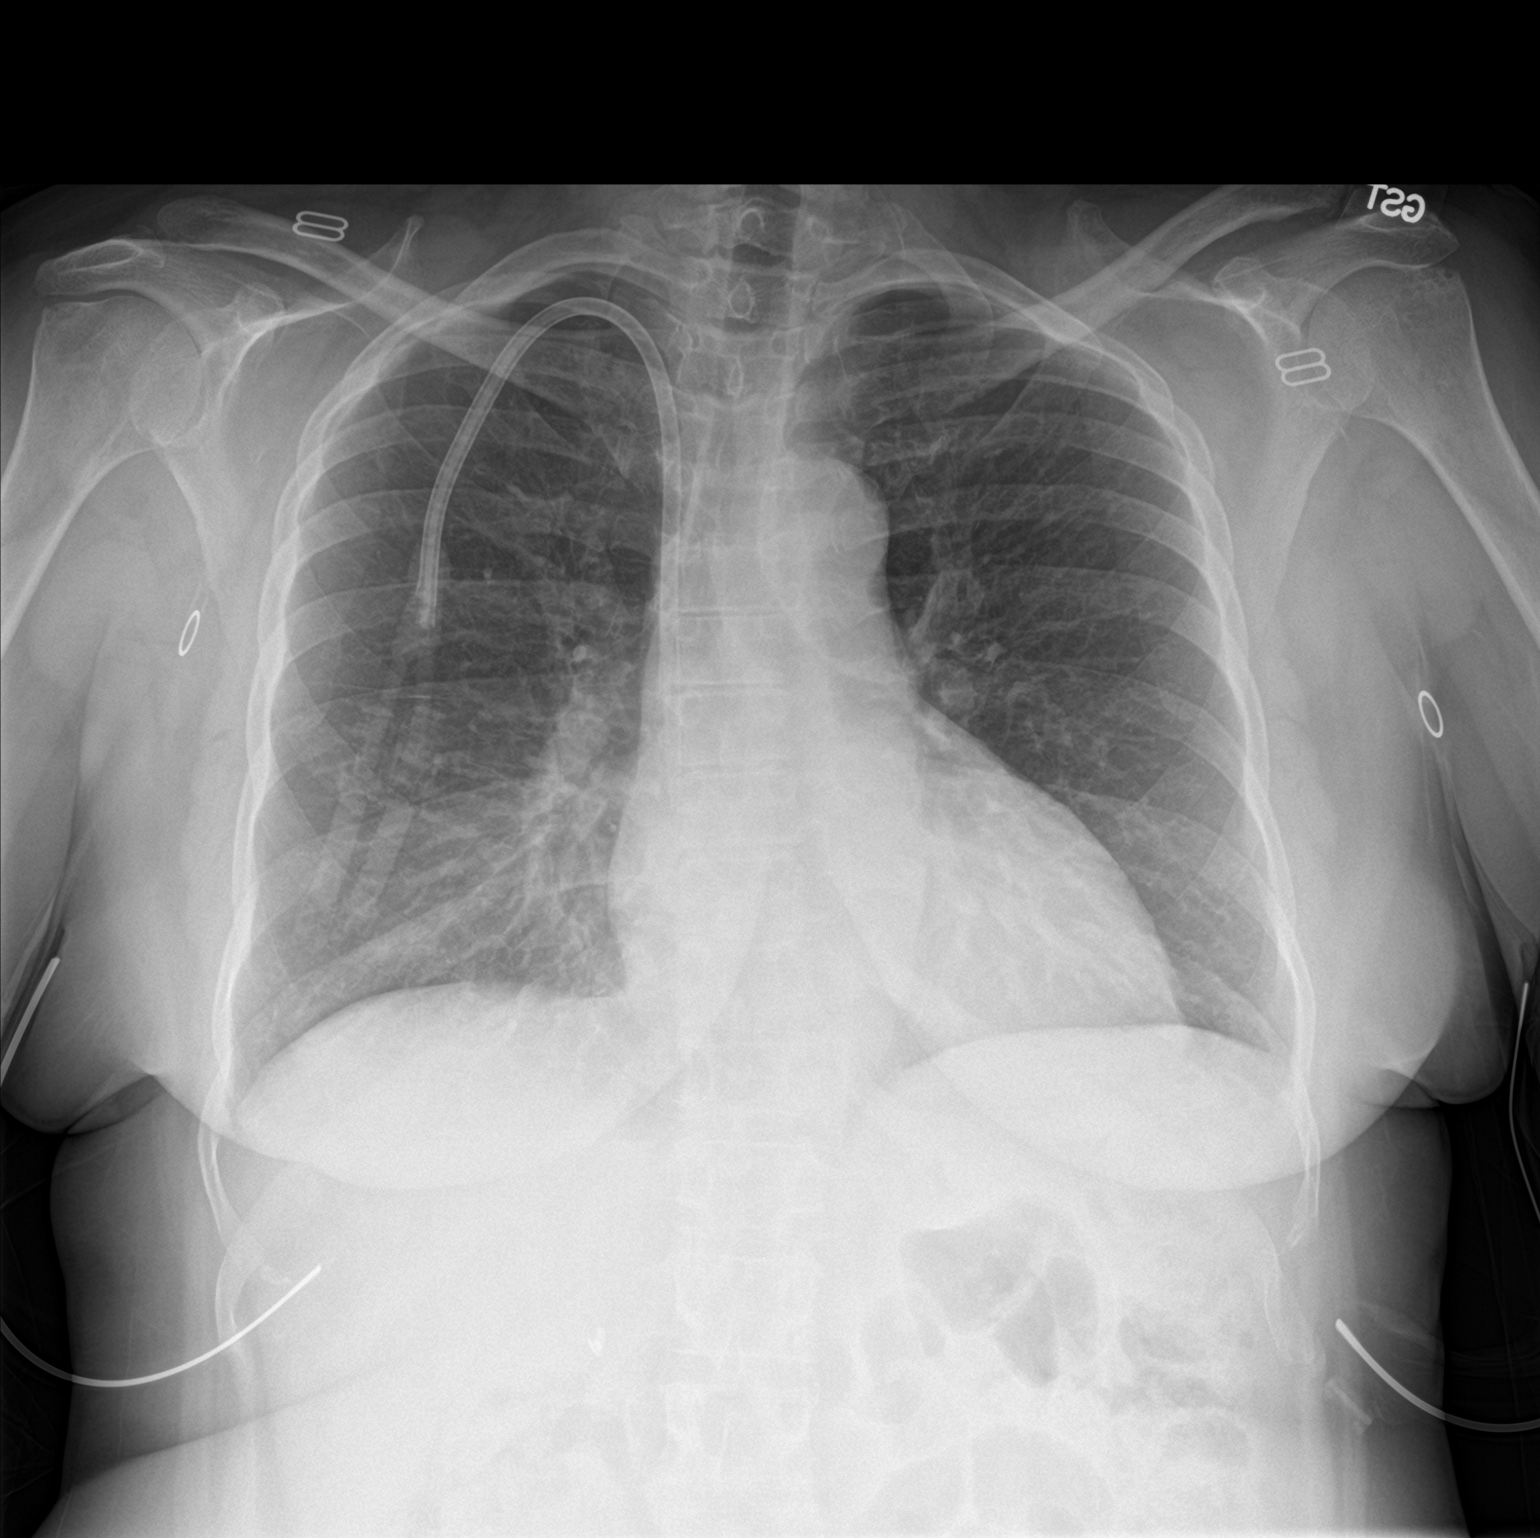

[chest lat]
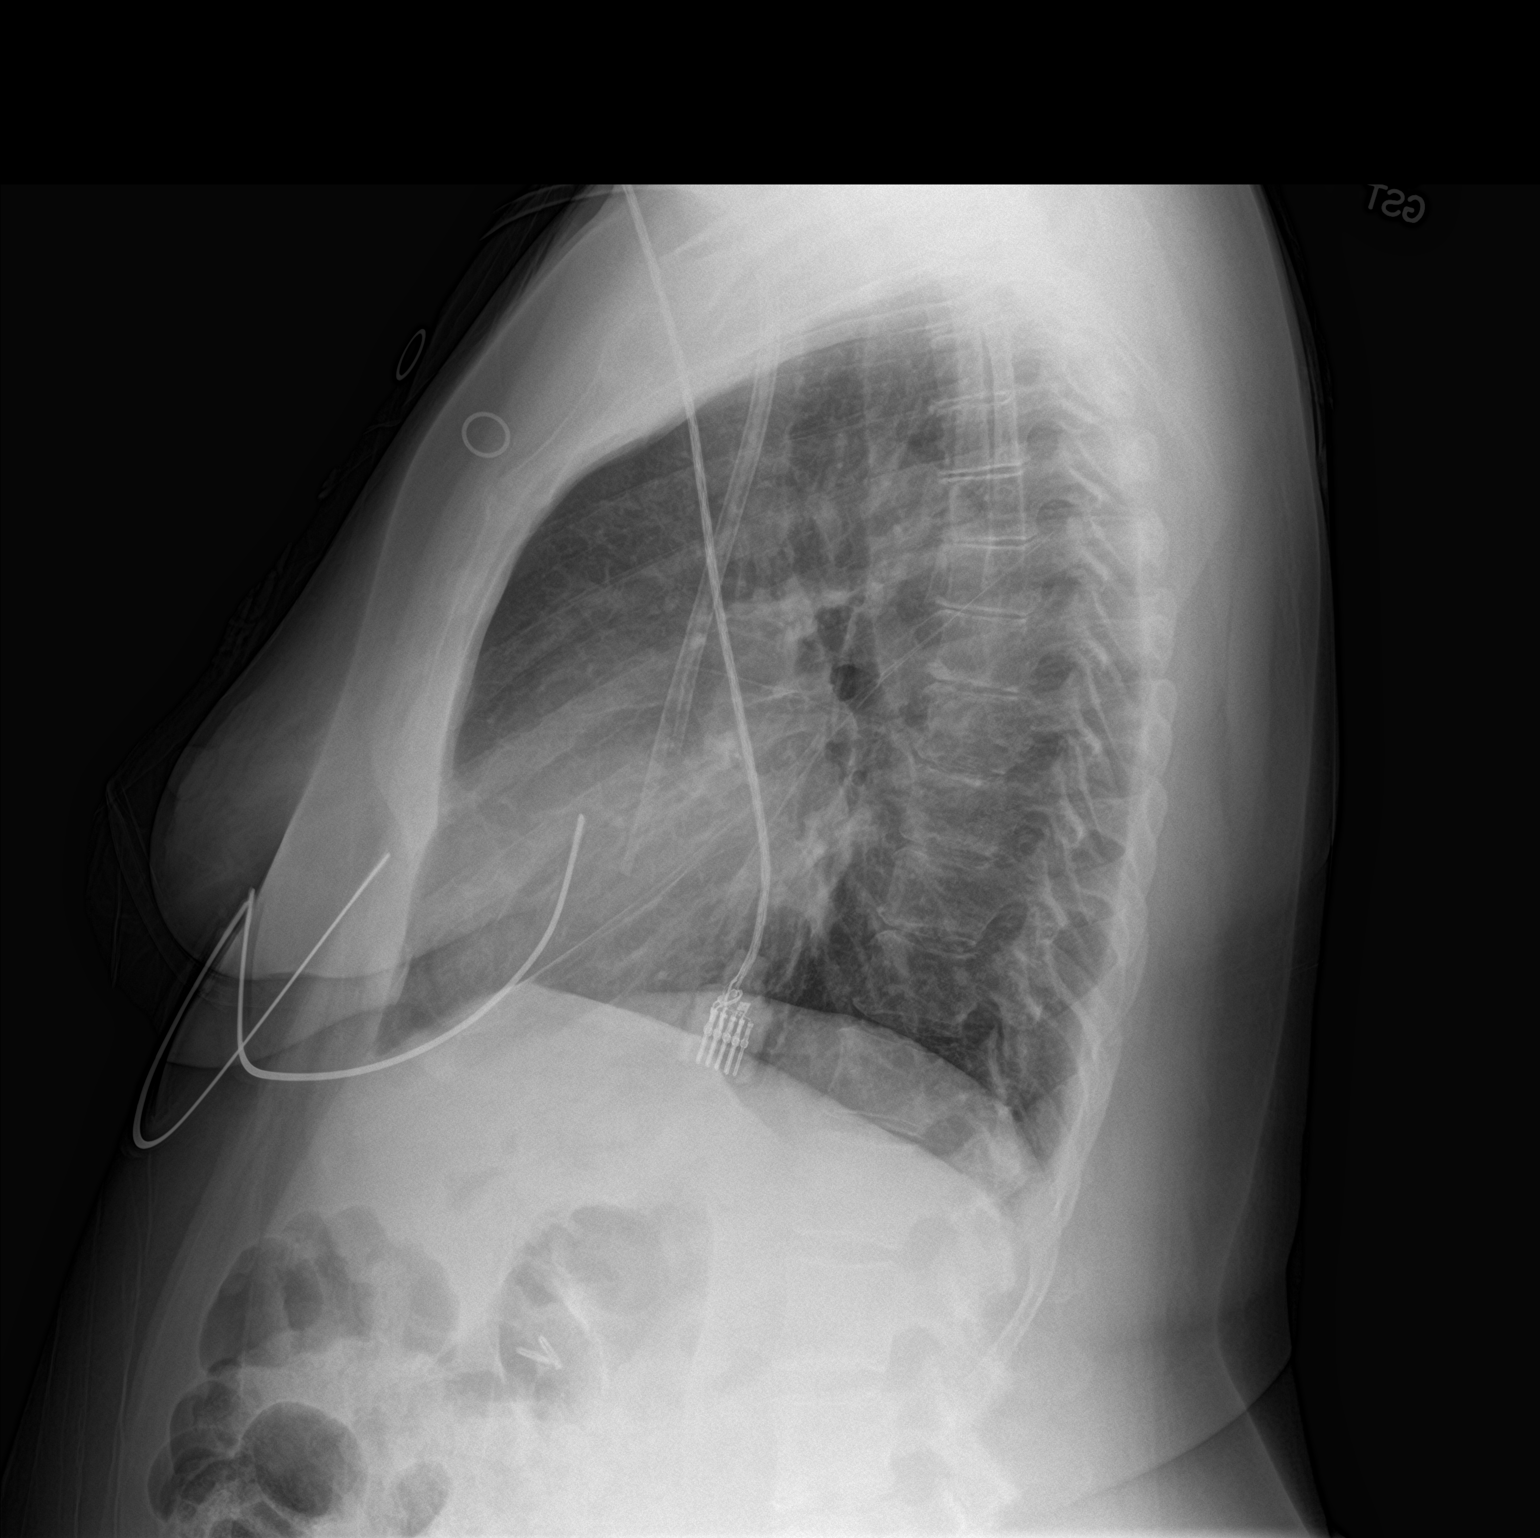

[2 of 2 positions shown; findings below may reference images not displayed]

FINDINGS: Interval placement of a right central venous catheter with tip over
the cavoatrial junction region. No pneumothorax. Heart size and
pulmonary vascularity are normal. Lungs are clear. No blunting of
costophrenic angles. No pneumothorax. Mediastinal contours appear
intact. Surgical clips in the right upper quadrant.
IMPRESSION: No evidence of active pulmonary disease.
